# Patient Record
Sex: Female | Born: 1935 | Race: White | Hispanic: No | Smoking: Never smoker
Health system: Southern US, Community
[De-identification: ages and names within clinical notes are randomized; demographics above are authoritative.]

## PROBLEM LIST (undated history)

## (undated) DIAGNOSIS — I509 Heart failure, unspecified: Secondary | ICD-10-CM

## (undated) DIAGNOSIS — R42 Dizziness and giddiness: Secondary | ICD-10-CM

## (undated) DIAGNOSIS — F0390 Unspecified dementia without behavioral disturbance: Secondary | ICD-10-CM

## (undated) DIAGNOSIS — F329 Major depressive disorder, single episode, unspecified: Secondary | ICD-10-CM

## (undated) DIAGNOSIS — I10 Essential (primary) hypertension: Secondary | ICD-10-CM

## (undated) DIAGNOSIS — G309 Alzheimer's disease, unspecified: Secondary | ICD-10-CM

## (undated) DIAGNOSIS — I428 Other cardiomyopathies: Secondary | ICD-10-CM

## (undated) DIAGNOSIS — Z90710 Acquired absence of both cervix and uterus: Secondary | ICD-10-CM

## (undated) DIAGNOSIS — F32A Depression, unspecified: Secondary | ICD-10-CM

## (undated) DIAGNOSIS — F0280 Dementia in other diseases classified elsewhere without behavioral disturbance: Secondary | ICD-10-CM

## (undated) DIAGNOSIS — D509 Iron deficiency anemia, unspecified: Secondary | ICD-10-CM

## (undated) HISTORY — DX: Acquired absence of both cervix and uterus: Z90.710

## (undated) HISTORY — DX: Essential (primary) hypertension: I10

## (undated) HISTORY — DX: Major depressive disorder, single episode, unspecified: F32.9

## (undated) HISTORY — DX: Depression, unspecified: F32.A

## (undated) HISTORY — DX: Dizziness and giddiness: R42

## (undated) HISTORY — DX: Heart failure, unspecified: I50.9

## (undated) HISTORY — DX: Other cardiomyopathies: I42.8

## (undated) HISTORY — DX: Unspecified dementia, unspecified severity, without behavioral disturbance, psychotic disturbance, mood disturbance, and anxiety: F03.90

## (undated) HISTORY — DX: Iron deficiency anemia, unspecified: D50.9

---

## 2000-02-27 ENCOUNTER — Encounter: Payer: Self-pay | Admitting: Internal Medicine

## 2000-02-27 ENCOUNTER — Encounter: Admission: RE | Admit: 2000-02-27 | Discharge: 2000-02-27 | Payer: Self-pay | Admitting: Internal Medicine

## 2002-09-03 ENCOUNTER — Encounter: Payer: Self-pay | Admitting: Emergency Medicine

## 2002-09-03 ENCOUNTER — Emergency Department (HOSPITAL_COMMUNITY): Admission: EM | Admit: 2002-09-03 | Discharge: 2002-09-03 | Payer: Self-pay | Admitting: Emergency Medicine

## 2005-11-07 ENCOUNTER — Emergency Department (HOSPITAL_COMMUNITY): Admission: EM | Admit: 2005-11-07 | Discharge: 2005-11-07 | Payer: Self-pay | Admitting: Emergency Medicine

## 2006-01-03 ENCOUNTER — Encounter: Admission: RE | Admit: 2006-01-03 | Discharge: 2006-01-03 | Payer: Self-pay | Admitting: Internal Medicine

## 2006-03-07 ENCOUNTER — Inpatient Hospital Stay (HOSPITAL_COMMUNITY): Admission: EM | Admit: 2006-03-07 | Discharge: 2006-03-16 | Payer: Self-pay | Admitting: Emergency Medicine

## 2006-03-07 ENCOUNTER — Ambulatory Visit: Payer: Self-pay | Admitting: Vascular Surgery

## 2006-03-07 ENCOUNTER — Encounter: Payer: Self-pay | Admitting: Cardiology

## 2006-03-07 ENCOUNTER — Ambulatory Visit: Payer: Self-pay | Admitting: Internal Medicine

## 2006-03-23 ENCOUNTER — Ambulatory Visit: Payer: Self-pay | Admitting: Internal Medicine

## 2006-03-23 LAB — CONVERTED CEMR LAB
BUN: 24 mg/dL — ABNORMAL HIGH (ref 6–23)
CO2: 27 meq/L (ref 19–32)
Creatinine, Ser: 1.1 mg/dL (ref 0.4–1.2)
Digitoxin Lvl: 0.3 ng/mL — ABNORMAL LOW (ref 0.8–2.0)
GFR calc Af Amer: 60 mL/min
Glucose, Bld: 91 mg/dL (ref 70–99)
Potassium: 4 meq/L (ref 3.5–5.1)
Pro B Natriuretic peptide (BNP): 927 pg/mL — ABNORMAL HIGH (ref 0.0–100.0)
Sodium: 135 meq/L (ref 135–145)

## 2006-04-03 ENCOUNTER — Ambulatory Visit: Payer: Self-pay | Admitting: Internal Medicine

## 2006-04-16 ENCOUNTER — Encounter: Payer: Self-pay | Admitting: Internal Medicine

## 2006-04-16 ENCOUNTER — Ambulatory Visit: Payer: Self-pay | Admitting: Internal Medicine

## 2006-04-16 ENCOUNTER — Ambulatory Visit: Payer: Self-pay

## 2006-04-16 LAB — CONVERTED CEMR LAB
CO2: 33 meq/L — ABNORMAL HIGH (ref 19–32)
Calcium: 9.5 mg/dL (ref 8.4–10.5)
Chloride: 102 meq/L (ref 96–112)
Cholesterol: 183 mg/dL (ref 0–200)
Creatinine, Ser: 1 mg/dL (ref 0.4–1.2)
GFR calc Af Amer: 67 mL/min
GFR calc non Af Amer: 56 mL/min
Glucose, Bld: 100 mg/dL — ABNORMAL HIGH (ref 70–99)
HDL: 52.8 mg/dL (ref >39.0)
Pro B Natriuretic peptide (BNP): 261 pg/mL — ABNORMAL HIGH (ref 0.0–100.0)
Sodium: 141 meq/L (ref 135–145)
Triglycerides: 83 mg/dL (ref 0–149)

## 2006-04-27 ENCOUNTER — Ambulatory Visit: Payer: Self-pay | Admitting: Internal Medicine

## 2006-05-24 ENCOUNTER — Ambulatory Visit: Payer: Self-pay | Admitting: Internal Medicine

## 2006-07-13 ENCOUNTER — Ambulatory Visit: Payer: Self-pay | Admitting: Internal Medicine

## 2006-09-13 ENCOUNTER — Ambulatory Visit: Payer: Self-pay | Admitting: Internal Medicine

## 2006-09-26 ENCOUNTER — Ambulatory Visit: Payer: Self-pay | Admitting: Internal Medicine

## 2006-09-26 LAB — CONVERTED CEMR LAB
ALT: 15 units/L (ref 0–35)
AST: 24 units/L (ref 0–37)
Albumin: 3.4 g/dL — ABNORMAL LOW (ref 3.5–5.2)
Basophils Relative: 0.9 % (ref 0.0–1.0)
CO2: 31 meq/L (ref 19–32)
Calcium: 9.5 mg/dL (ref 8.4–10.5)
Chloride: 108 meq/L (ref 96–112)
Eosinophils Absolute: 0.5 10*3/uL (ref 0.0–0.6)
Eosinophils Relative: 6.3 % — ABNORMAL HIGH (ref 0.0–5.0)
GFR calc Af Amer: 67 mL/min
Glucose, Bld: 90 mg/dL (ref 70–99)
HDL: 52.6 mg/dL (ref >39.0)
LDL Cholesterol: 51 mg/dL (ref 0–99)
MCHC: 34.1 g/dL (ref 30.0–36.0)
MCV: 88.9 fL (ref 78.0–100.0)
Neutro Abs: 4.6 10*3/uL (ref 1.4–7.7)
Neutrophils Relative %: 56.2 % (ref 43.0–77.0)
Potassium: 4.4 meq/L (ref 3.5–5.1)
RBC: 4.14 M/uL (ref 3.87–5.11)
Total Protein: 6.7 g/dL (ref 6.0–8.3)

## 2006-12-18 ENCOUNTER — Ambulatory Visit: Payer: Self-pay | Admitting: Internal Medicine

## 2006-12-18 LAB — CONVERTED CEMR LAB
AST: 22 units/L (ref 0–37)
Albumin: 3.3 g/dL — ABNORMAL LOW (ref 3.5–5.2)
Alkaline Phosphatase: 76 units/L (ref 39–117)
BUN: 28 mg/dL — ABNORMAL HIGH (ref 6–23)
Bilirubin, Direct: 0.1 mg/dL (ref 0.0–0.3)
CO2: 27 meq/L (ref 19–32)
Calcium: 9.3 mg/dL (ref 8.4–10.5)
Cholesterol: 123 mg/dL (ref 0–200)
GFR calc Af Amer: 60 mL/min
Glucose, Bld: 90 mg/dL (ref 70–99)
Potassium: 4.3 meq/L (ref 3.5–5.1)

## 2006-12-26 ENCOUNTER — Ambulatory Visit: Payer: Self-pay

## 2007-01-30 ENCOUNTER — Encounter: Admission: RE | Admit: 2007-01-30 | Discharge: 2007-01-30 | Payer: Self-pay | Admitting: Internal Medicine

## 2007-04-19 ENCOUNTER — Ambulatory Visit: Payer: Self-pay | Admitting: Internal Medicine

## 2007-04-19 LAB — CONVERTED CEMR LAB
ALT: 15 units/L (ref 0–35)
Albumin: 3.3 g/dL — ABNORMAL LOW (ref 3.5–5.2)
BUN: 24 mg/dL — ABNORMAL HIGH (ref 6–23)
Chloride: 104 meq/L (ref 96–112)
Creatinine, Ser: 1.1 mg/dL (ref 0.4–1.2)
GFR calc Af Amer: 60 mL/min
GFR calc non Af Amer: 50 mL/min
LDL Cholesterol: 39 mg/dL (ref 0–99)
Potassium: 4.3 meq/L (ref 3.5–5.1)
Pro B Natriuretic peptide (BNP): 121 pg/mL — ABNORMAL HIGH (ref 0.0–100.0)
Total Bilirubin: 0.8 mg/dL (ref 0.3–1.2)
Total Protein: 6.6 g/dL (ref 6.0–8.3)
Triglycerides: 79 mg/dL (ref 0–149)

## 2007-04-23 ENCOUNTER — Ambulatory Visit: Payer: Self-pay | Admitting: Internal Medicine

## 2007-04-23 LAB — CONVERTED CEMR LAB
CO2: 29 meq/L (ref 19–32)
Calcium: 9.4 mg/dL (ref 8.4–10.5)
GFR calc Af Amer: 55 mL/min
GFR calc non Af Amer: 45 mL/min

## 2007-05-01 ENCOUNTER — Ambulatory Visit: Payer: Self-pay | Admitting: Cardiovascular Disease

## 2007-05-13 ENCOUNTER — Ambulatory Visit: Payer: Self-pay | Admitting: Internal Medicine

## 2007-05-13 LAB — CONVERTED CEMR LAB
BUN: 33 mg/dL — ABNORMAL HIGH (ref 6–23)
Chloride: 104 meq/L (ref 96–112)
Creatinine, Ser: 1.3 mg/dL — ABNORMAL HIGH (ref 0.4–1.2)

## 2007-06-13 ENCOUNTER — Inpatient Hospital Stay (HOSPITAL_COMMUNITY): Admission: EM | Admit: 2007-06-13 | Discharge: 2007-06-15 | Payer: Self-pay | Admitting: Emergency Medicine

## 2007-07-18 ENCOUNTER — Ambulatory Visit: Payer: Self-pay | Admitting: Internal Medicine

## 2007-08-13 ENCOUNTER — Emergency Department (HOSPITAL_COMMUNITY): Admission: EM | Admit: 2007-08-13 | Discharge: 2007-08-13 | Payer: Self-pay | Admitting: Emergency Medicine

## 2007-10-21 ENCOUNTER — Encounter: Payer: Self-pay | Admitting: Internal Medicine

## 2007-10-22 ENCOUNTER — Ambulatory Visit: Payer: Self-pay | Admitting: Internal Medicine

## 2007-10-22 LAB — CONVERTED CEMR LAB
AST: 27 units/L (ref 0–37)
Alkaline Phosphatase: 83 units/L (ref 39–117)
Basophils Relative: 0.9 % (ref 0.0–3.0)
Bilirubin, Direct: 0.2 mg/dL (ref 0.0–0.3)
Cholesterol: 132 mg/dL (ref 0–200)
Eosinophils Absolute: 0.2 10*3/uL (ref 0.0–0.7)
GFR calc non Af Amer: 55 mL/min
Glucose, Bld: 87 mg/dL (ref 70–99)
HCT: 38.7 % (ref 36.0–46.0)
HDL: 56.2 mg/dL (ref >39.0)
Hemoglobin: 13.5 g/dL (ref 12.0–15.0)
Iron: 35 ug/dL — ABNORMAL LOW (ref 42–145)
LDL Cholesterol: 62 mg/dL (ref 0–99)
MCHC: 34.8 g/dL (ref 30.0–36.0)
MCV: 89.6 fL (ref 78.0–100.0)
Monocytes Absolute: 0.7 10*3/uL (ref 0.1–1.0)
Monocytes Relative: 10.7 % (ref 3.0–12.0)
Neutrophils Relative %: 64.6 % (ref 43.0–77.0)
Platelets: 265 10*3/uL (ref 150–400)
Sodium: 139 meq/L (ref 135–145)
Total Bilirubin: 0.6 mg/dL (ref 0.3–1.2)
Total CHOL/HDL Ratio: 2.3
Total Protein: 6.7 g/dL (ref 6.0–8.3)
Triglycerides: 70 mg/dL (ref 0–149)
VLDL: 14 mg/dL (ref 0–40)

## 2008-02-11 ENCOUNTER — Ambulatory Visit: Payer: Self-pay | Admitting: Internal Medicine

## 2008-05-01 ENCOUNTER — Encounter (INDEPENDENT_AMBULATORY_CARE_PROVIDER_SITE_OTHER): Payer: Self-pay

## 2008-05-04 ENCOUNTER — Encounter: Payer: Self-pay | Admitting: Internal Medicine

## 2008-05-04 ENCOUNTER — Ambulatory Visit: Payer: Self-pay | Admitting: Internal Medicine

## 2008-05-04 DIAGNOSIS — I501 Left ventricular failure: Secondary | ICD-10-CM

## 2008-05-04 DIAGNOSIS — I10 Essential (primary) hypertension: Secondary | ICD-10-CM

## 2009-01-04 ENCOUNTER — Ambulatory Visit: Payer: Self-pay | Admitting: Internal Medicine

## 2009-03-03 ENCOUNTER — Emergency Department (HOSPITAL_COMMUNITY): Admission: EM | Admit: 2009-03-03 | Discharge: 2009-03-03 | Payer: Self-pay | Admitting: Emergency Medicine

## 2009-06-25 ENCOUNTER — Ambulatory Visit: Payer: Self-pay | Admitting: Internal Medicine

## 2009-06-25 DIAGNOSIS — E785 Hyperlipidemia, unspecified: Secondary | ICD-10-CM

## 2009-07-20 ENCOUNTER — Ambulatory Visit: Payer: Self-pay | Admitting: Cardiology

## 2009-07-20 ENCOUNTER — Ambulatory Visit (HOSPITAL_COMMUNITY)
Admission: RE | Admit: 2009-07-20 | Discharge: 2009-07-20 | Payer: Self-pay | Source: Home / Self Care | Admitting: Internal Medicine

## 2009-07-20 ENCOUNTER — Ambulatory Visit: Payer: Self-pay

## 2009-07-20 ENCOUNTER — Encounter: Payer: Self-pay | Admitting: Internal Medicine

## 2009-12-31 ENCOUNTER — Ambulatory Visit: Payer: Self-pay | Admitting: Internal Medicine

## 2009-12-31 ENCOUNTER — Telehealth: Payer: Self-pay | Admitting: Internal Medicine

## 2009-12-31 ENCOUNTER — Encounter: Payer: Self-pay | Admitting: Internal Medicine

## 2009-12-31 DIAGNOSIS — I959 Hypotension, unspecified: Secondary | ICD-10-CM

## 2009-12-31 LAB — CONVERTED CEMR LAB
Basophils Relative: 1.2 % (ref 0.0–3.0)
CO2: 27 meq/L (ref 19–32)
Calcium: 9.4 mg/dL (ref 8.4–10.5)
GFR calc non Af Amer: 46.48 mL/min — ABNORMAL LOW (ref >60.00)
Glucose, Bld: 86 mg/dL (ref 70–99)
HCT: 21.3 % — CL (ref 36.0–46.0)
MCHC: 31.3 g/dL (ref 30.0–36.0)
MCV: 66 fL — ABNORMAL LOW (ref 78.0–100.0)
Monocytes Relative: 15.8 % — ABNORMAL HIGH (ref 3.0–12.0)
Platelets: 354 10*3/uL (ref 150.0–400.0)
Potassium: 5.1 meq/L (ref 3.5–5.1)
RDW: 20.4 % — ABNORMAL HIGH (ref 11.5–14.6)
WBC: 6.4 10*3/uL (ref 4.5–10.5)

## 2010-01-01 ENCOUNTER — Emergency Department (HOSPITAL_COMMUNITY)
Admission: EM | Admit: 2010-01-01 | Discharge: 2010-01-01 | Payer: Self-pay | Source: Home / Self Care | Admitting: Emergency Medicine

## 2010-02-20 LAB — CONVERTED CEMR LAB
ALT: 12 units/L (ref 0–35)
Albumin: 3.5 g/dL (ref 3.5–5.2)
Basophils Absolute: 0.1 10*3/uL (ref 0.0–0.1)
Basophils Relative: 1 % (ref 0.0–1.0)
CO2: 29 meq/L (ref 19–32)
Chloride: 102 meq/L (ref 96–112)
Eosinophils Absolute: 0.2 10*3/uL (ref 0.0–0.7)
Eosinophils Relative: 3.3 % (ref 0.0–5.0)
GFR calc non Af Amer: 50 mL/min
Lymphocytes Relative: 21.3 % (ref 12.0–46.0)
MCHC: 32.7 g/dL (ref 30.0–36.0)
RDW: 29.6 % — ABNORMAL HIGH (ref 11.5–14.6)
Sodium: 137 meq/L (ref 135–145)
WBC: 7.5 10*3/uL (ref 4.5–10.5)

## 2010-02-24 NOTE — Assessment & Plan Note (Signed)
Summary: 6 month rov/sl  Medications Added LISINOPRIL 20 MG TABS (LISINOPRIL) Take one tablet by mouth daily. FUROSEMIDE 20 MG TABS (FUROSEMIDE) Take one tablet by mouth daily.        Visit Type:  Follow-up Primary Provider:  Leanord Hawking  CC:  no complaints.  History of Present Illness: [name] is a delightful 77 year old woman with a  history of congestive heart failures due to probable nonischemic cardiomyopathy, EF was about 20%, right now normalized to 60%.  She also has a history of hypertension, hyperlipidemia, and iron-deficiency anemia, for which she has refused endoscopy. She returns for routine f/u.  Doing well. Still getting back and forth to dining room with walker. Gets SOB if she walks too fast. Son says she is slowing down. Marland Kitchen No swelling or orthopnea. No dizziness, presyncope or syncope. BP checked regulary at [facility] seems to be running a bit low (low 100s). Occasionally a little dizzy when she stands up.    Current Medications (verified): 1)  Colace .... Take As Directed 2)  Zoloft 100 Mg Tabs (Sertraline Hcl) .... Take One Tab Once Daily 3)  Potassium Chloride Crys Cr 20 Meq Cr-Tabs (Potassium Chloride Crys Cr) .... Take One Tablet By Mouth Twice A Day 4)  Lisinopril 20 Mg Tabs (Lisinopril) .... Take One Tablet By Mouth Two Times A Day 5)  Aspirin 81 Mg Tbec (Aspirin) .... Take One Tablet By Mouth Daily 6)  Coreg 25 Mg Tabs (Carvedilol) .... Take One Tab Two Times A Day 7)  Lasix 40 Mg Tabs (Furosemide) .... Take One Tab Once Daily 8)  Qc Tussin Cf 5-10-100 Mg/66ml Liqd (Phenylephrine-Dm-Gg) .... As Needed For Congestion 9)  Ibuprofen 400 Mg Tabs (Ibuprofen) .... Take One Tablet Every 4 Hrs As Needed 10)  Simvastatin 20 Mg Tabs (Simvastatin) .... Take One Tablet By Mouth Daily At Bedtime  Allergies: No Known Drug Allergies  Past History:  Past Medical History: Last updated: 05/04/2008  1. CHF, due to probable non-ischemic cardiomyopathy  (recovered)        a. ECHO 02/2006: EF 15-20%        b. ECHO 12/2006 . EF 60%  2. HTN, previously severe  3. Iron deficiency anemia       a. refuses endoscopy  4. Dementia , very mild  5. Dizziness  6. Depression  7. Hearing loss  Review of Systems       As per HPI and past medical history; otherwise all systems negative.   Vital Signs:  Patient profile:   77 year old female Height:      61 inches Weight:      165 pounds BMI:     31.29 Pulse rate:   80 / minute BP sitting:   98 / 54  (right arm) Cuff size:   regular  Vitals Entered By: Hardin Negus, RMA (December 31, 2009 9:54 AM)  Physical Exam  General:  Elderly. well appearing. no resp difficulty HEENT: normal Neck: supple. no JVD. Carotids 2+ bilat; no bruits. No lymphadenopathy or thryomegaly appreciated. Cor: PMI nondisplaced. Regular rate & rhythm. No rubs, gallops, murmur. Lungs: clear Abdomen: soft, nontender, nondistended. No hepatosplenomegaly. No bruits or masses. Good bowel sounds. Extremities: no cyanosis, clubbing, rash, no edema on R none on left Neuro: alert & orientedx3, cranial nerves grossly intact. moves all 4 extremities w/o difficulty. affect pleasant    Impression & Recommendations:  Problem # 1:  HYPOTENSION, UNSPECIFIED (ICD-458.9) BP running low. Will decrease lasix to  20 once daily and lisinopril to 20 once daily . [facility] every other day for 2 weeks and if SBP continue to run low they should contact us. Check BMET and CBC today. Watch closely for edema.   Problem # 2:  CONGESTIVE HEART FAILURE, LEFT (ICD-428.1) LVEF recovered. No signs or symptoms.  Other Orders: EKG w/ Interpretation (93000) TLB-BMP (Basic Metabolic Panel-BMET) (80048-METABOL) TLB-CBC Platelet - w/Differential (85025-CBCD)  Patient Instructions: 1)  Your physician recommends that you schedule a follow-up appointment in: 4 months. 2)  Your physician has recommended you make  the following change in your medication: DECREASE lasix to 20mg  by mouth daily and DECREASE Lisinopril to 20mg  by mouth daily.  3)  PLEASE monitor her blood pressure every other day for 2 weeks and call us if her SBP < 100 or you notice any swelling. Prescriptions: LISINOPRIL 20 MG TABS (LISINOPRIL) Take one tablet by mouth daily.  #30 x 6   Entered by:   Ellender Hose RN   Authorized by:   Dolores Patty, MD, [facility]   Signed by:   Ellender Hose RN on 12/31/2009   Method used:   Print then Give to Patient   RxID:   [id number] FUROSEMIDE 20 MG TABS (FUROSEMIDE) Take one tablet by mouth daily.  #30 x 6   Entered by:   Ellender Hose RN   Authorized by:   Dolores Patty, MD, [facility]   Signed by:   Ellender Hose RN on 12/31/2009   Method used:   Print then Give to Patient   RxID:   [id number]

## 2010-02-24 NOTE — Progress Notes (Signed)
Summary: Low Hgb (need to fax results to pt facility)   Phone Note From Other Clinic   Caller: Jacki Cones from the [employer] of Call: Call received from [place] in the lab at [facility]- she reports the pt's Hgb is 6.7/ Hct 21.3. I have reviewed this with Dr. Gala Romney. He states the pt has known iron deficiency anemia, but has refused a workup for this. He wants [place] to fax the pt's lab results to [facility] and then call and let them know to have the MD there f/u on this. He would also like [place] to call the pt's son and let him know the results. I will forward this message to the triage desktop since the pt's labs are not yet posted to her chart. Initial call taken by: Sherri Rad, RN, BSN,  December 31, 2009 2:20 PM  Follow-up for Phone Call        Called pt's nurse at [facility] and she advised that I can fax lab work to her for Dr.Robson to review at [phone]. Layne Benton, RN, BSN  December 31, 2009 4:45 PM

## 2010-02-24 NOTE — Assessment & Plan Note (Signed)
Summary: f81m  Medications Added QC TUSSIN CF 5-10-100 MG/5ML LIQD (PHENYLEPHRINE-DM-GG) as needed for congestion IBUPROFEN 400 MG TABS (IBUPROFEN) take one tablet every 4 hrs as needed SIMVASTATIN 20 MG TABS (SIMVASTATIN) Take one tablet by mouth daily at bedtime      Allergies Added: NKDA  Primary Provider:  Leanord Hawking  CC:  f58m.  History of Present Illness: [name] is a delightful 77 year old woman with a  history of congestive heart failures due to probable nonischemic cardiomyopathy, EF was about 20%, right now normalized to 60%.  She also has a history of hypertension, hyperlipidemia, and iron-deficiency anemia, for which she has refused endoscopy. She returns for routine f/u.  Doing great.  Gets SOB walking longer distances. No CP. Walker really helping. No swelling or orthopnea. No dizziness, presyncope or syncope. For some reason simvastatin stopped. Denies muscle aches.    No SBP log from [facility] sent.     Current Medications (verified): 1)  Colace .... Take As Directed 2)  Zoloft 100 Mg Tabs (Sertraline Hcl) .... Take One Tab Once Daily 3)  Potassium Chloride Crys Cr 20 Meq Cr-Tabs (Potassium Chloride Crys Cr) .... Take One Tablet By Mouth Twice A Day 4)  Lisinopril 20 Mg Tabs (Lisinopril) .... Take One Tablet By Mouth Two Times A Day 5)  Aspirin 81 Mg Tbec (Aspirin) .... Take One Tablet By Mouth Daily 6)  Coreg 25 Mg Tabs (Carvedilol) .... Take One Tab Two Times A Day 7)  Lasix 40 Mg Tabs (Furosemide) .... Take One Tab Once Daily 8)  Qc Tussin Cf 5-10-100 Mg/38ml Liqd (Phenylephrine-Dm-Gg) .... As Needed For Congestion 9)  Ibuprofen 400 Mg Tabs (Ibuprofen) .... Take One Tablet Every 4 Hrs As Needed  Allergies (verified): No Known Drug Allergies  Past History:  Past Medical History: Reviewed history from 05/04/2008 and no changes required.  1. CHF, due to probable non-ischemic cardiomyopathy (recovered)        a. ECHO 02/2006: EF 15-20%  b. ECHO 12/2006 . EF 60%  2. HTN, previously severe  3. Iron deficiency anemia       a. refuses endoscopy  4. Dementia , very mild  5. Dizziness  6. Depression  7. Hearing loss  Review of Systems       As per HPI and past medical history; otherwise all systems negative.   Vital Signs:  Patient profile:   77 year old female Height:      61 inches Weight:      167 pounds BMI:     31.67 Pulse rate:   80 / minute Pulse rhythm:   regular BP sitting:   100 / 54  (left arm) Cuff size:   large  Vitals Entered By: Judithe Modest CMA (June 25, 2009 10:22 AM)  Physical Exam  General:  Elderly. well appearing. no resp difficulty HEENT: normal Neck: supple. no JVD. Carotids 2+ bilat; no bruits. No lymphadenopathy or thryomegaly appreciated. Cor: PMI nondisplaced. Regular rate & rhythm. No rubs, gallops, murmur. Lungs: clear Abdomen: soft, nontender, nondistended. No hepatosplenomegaly. No bruits or masses. Good bowel sounds. Extremities: no cyanosis, clubbing, rash, tr edema on R none on left Neuro: alert & orientedx3, cranial nerves grossly intact. moves all 4 extremities w/o difficulty. affect pleasant    Impression & Recommendations:  Problem # 1:  CONGESTIVE HEART FAILURE, LEFT (ICD-428.1) LVEF recovered. No signs or symptoms. Last echo 2008. Will recheck echo to make sure EF remains stable. Check BNP.  Problem # 2:  HYPERTENSION, BENIGN (ICD-401.1) Blood pressure well controlled. Continue current regimen.  Problem # 3:  HYPERLIPIDEMIA-MIXED (ICD-272.4) For some reason simvastatinwas stopped. Will restart at previous dose.   Other Orders: EKG w/ Interpretation (93000)  Patient Instructions: 1)  Restart Simvastatin 20mg  daily 2)  Labs at Home (bnp 425.4) 3)  Your physician has requested that you have an echocardiogram.  Echocardiography is a painless test that uses sound waves to create images of your heart. It provides your doctor with information about the size and  shape of your heart and how well your heart's chambers and valves are working.  This procedure takes approximately one hour. There are no restrictions for this procedure. 4)  Your physician wants you to follow-up in:  6 months.  You will receive a reminder letter in the mail two months in advance. If you don't receive a letter, please call our office to schedule the follow-up appointment.   Appended Document: orders    Clinical Lists Changes  Orders: Added new Referral order of Echocardiogram (Echo) - Signed

## 2010-04-04 LAB — COMPREHENSIVE METABOLIC PANEL
AST: 24 U/L (ref 0–37)
Albumin: 3.2 g/dL — ABNORMAL LOW (ref 3.5–5.2)
Alkaline Phosphatase: 72 U/L (ref 39–117)
BUN: 27 mg/dL — ABNORMAL HIGH (ref 6–23)
CO2: 27 mEq/L (ref 19–32)
Calcium: 9 mg/dL (ref 8.4–10.5)
Chloride: 100 mEq/L (ref 96–112)
Creatinine, Ser: 1.12 mg/dL (ref 0.4–1.2)
GFR calc Af Amer: 55 mL/min — ABNORMAL LOW (ref >60)
GFR calc non Af Amer: 46 mL/min — ABNORMAL LOW (ref >60)
Potassium: 4.9 mEq/L (ref 3.5–5.1)
Total Bilirubin: 0.6 mg/dL (ref 0.3–1.2)

## 2010-04-04 LAB — CROSSMATCH
ABO/RH(D): A POS
Unit division: 0

## 2010-04-04 LAB — URINALYSIS, ROUTINE W REFLEX MICROSCOPIC
Bilirubin Urine: NEGATIVE
Glucose, UA: NEGATIVE mg/dL
Hgb urine dipstick: NEGATIVE
Ketones, ur: NEGATIVE mg/dL
Specific Gravity, Urine: 1.008 (ref 1.005–1.030)
pH: 5.5 (ref 5.0–8.0)

## 2010-04-04 LAB — DIFFERENTIAL
Eosinophils Absolute: 0.1 10*3/uL (ref 0.0–0.7)
Eosinophils Relative: 2 % (ref 0–5)
Monocytes Absolute: 1 10*3/uL (ref 0.1–1.0)
Monocytes Relative: 13 % — ABNORMAL HIGH (ref 3–12)
Neutrophils Relative %: 70 % (ref 43–77)

## 2010-04-04 LAB — CBC: RBC: 3.21 MIL/uL — ABNORMAL LOW (ref 3.87–5.11)

## 2010-04-13 LAB — URINALYSIS, ROUTINE W REFLEX MICROSCOPIC
Bilirubin Urine: NEGATIVE
Glucose, UA: NEGATIVE mg/dL
Hgb urine dipstick: NEGATIVE
Nitrite: NEGATIVE
pH: 7 (ref 5.0–8.0)

## 2010-05-19 ENCOUNTER — Encounter: Payer: Self-pay | Admitting: Internal Medicine

## 2010-05-20 ENCOUNTER — Encounter: Payer: Self-pay | Admitting: Internal Medicine

## 2010-05-20 ENCOUNTER — Ambulatory Visit (INDEPENDENT_AMBULATORY_CARE_PROVIDER_SITE_OTHER): Payer: Medicare Other | Admitting: Internal Medicine

## 2010-05-20 VITALS — BP 148/80 | HR 78 | Resp 18 | Ht 62.0 in | Wt 164.8 lb

## 2010-05-20 DIAGNOSIS — I501 Left ventricular failure: Secondary | ICD-10-CM

## 2010-05-20 DIAGNOSIS — I10 Essential (primary) hypertension: Secondary | ICD-10-CM

## 2010-05-20 NOTE — Progress Notes (Signed)
HPI:  [name] is a delightful 77 year old woman (Happy Birthday!) with a  history of congestive heart failures due to probable nonischemic cardiomyopathy, EF was about 20%, right now normalized to 60%.  She also has a history of hypertension, hyperlipidemia, and iron-deficiency anemia, for which she has refused endoscopy. She returns for routine f/u.  Continues to do very well from a cardiac perspective. A few weeks ago had an episode when her BP went up to 160-180 range. BP trend on nursing chart 101/57 to 142/72. No CP or SOB. Ambulates slowly with walker. On iron supplementation and gets CB checks every other month and hgb stable    ROS: All systems negative except as listed in HPI, PMH and Problem List.  Past Medical History  Diagnosis Date  . CHF (congestive heart failure)     due to probable non-ischemic cardiomyopathy (recovered)  a. ECHO 02/2006: EF 15-20% b. ECHO 12/2006 . EF 60%   . Non-ischemic cardiomyopathy   . HTN (hypertension)     previously severe  . Iron deficiency anemia     a. refuses endoscopy  . Dementia     very mild  . Dizziness   . Depression   . Hearing loss   . H/O: hysterectomy     Current Outpatient Prescriptions  Medication Sig Dispense Refill  . aspirin 81 MG EC tablet Take 81 mg by mouth daily.        . carvedilol (COREG) 25 MG tablet Take 25 mg by mouth 2 (two) times daily.        . ferrous sulfate 325 (65 FE) MG tablet Take 325 mg by mouth daily with breakfast.        . furosemide (LASIX) 20 MG tablet Take 20 mg by mouth daily.        Marland Kitchen ibuprofen (ADVIL,MOTRIN) 400 MG tablet Take 400 mg by mouth every 4 (four) hours as needed.        Marland Kitchen lisinopril (PRINIVIL,ZESTRIL) 20 MG tablet Take 20 mg by mouth daily.        . NON FORMULARY Colace - Take as Directed       . Phenylephrine-DM-GG (QC TUSSIN CF) 5-10-100 MG/5ML LIQD As needed for congestion.       . potassium chloride SA (K-DUR,KLOR-CON) 20 MEQ tablet Take 20 mEq by mouth 2 (two) times daily.         . sertraline (ZOLOFT) 100 MG tablet Take 100 mg by mouth daily.        . simvastatin (ZOCOR) 20 MG tablet Take 20 mg by mouth at bedtime.           PHYSICAL EXAM: Filed Vitals:   05/20/10 1009  BP: 148/80  Pulse: 78  Resp: 18   General:  Elderly. well appearing. no resp difficulty HEENT: normal Neck: supple. no JVD. Carotids 2+ bilat; no bruits. No lymphadenopathy or thryomegaly appreciated. Cor: PMI nondisplaced. Regular rate & rhythm. No rubs, gallops, murmur. Lungs: clear Abdomen: soft, nontender, nondistended. No hepatosplenomegaly. No bruits or masses. Good bowel sounds. Extremities: no cyanosis, clubbing, rash, trace edema on R none on left Neuro: alert & orientedx3, cranial nerves grossly intact. moves all 4 extremities w/o difficulty. affect pleasant   ECG: NSR 78. Septal qs Poor R wave progression. No ST-T wave abnormalities.     ASSESSMENT & PLAN:

## 2010-05-20 NOTE — Assessment & Plan Note (Signed)
BP very labile and somewhat elevated at times but not dangerously so. I think the risk of symptomatic hypotension is much higher than a bit of hypertension so I am OK with letting her BP drift a little bit. Would only increase BP meds if SBP consistently over 150 or 160.

## 2010-05-20 NOTE — Assessment & Plan Note (Signed)
Continues to do very well. LV function normalized. Volume status looks good. Continue current therapy.

## 2010-06-07 NOTE — Assessment & Plan Note (Signed)
[facility]                            CARDIOLOGY OFFICE NOTE   CLORENE, NERIO                       MRN:          [id number]  DATE:07/18/2007                            DOB:          Dec 10, 1916    INTERVAL HISTORY:  [name] is a very pleasant 77 year old woman with a  history of congestive heart failure due to probable nonischemic  cardiomyopathy.  Initial EF was about 20%,  but it is now normalized to  60%.  She has a history of hypertension and hyperlipidemia.  She has  been living at the [facility]   Unfortunately, she was recently admitted in May with some mild chest  discomfort and found to be severely anemic with a hemoglobin of 7.  She  was transfused several units.  She refused endoscopy.  She was started  on iron and [facility].  Her cardiac markers  remain negative in the hospital.   She is back feeling better.  She does feel weak at times, but denies any  chest pain or shortness of breath.  She notes a little swelling of the  right ankle today, but has not had significant troubles with this.  Her  hemoglobin most recently was checked about 3 weeks ago and was 11.   CURRENT MEDICATIONS:  1. Lasix 40 mg a day.  2. Colace 100 at night.  3. Sertraline 100 mg a day.  4. Potassium 20 b.i.d.  5. Lisinopril 20 a day.  6. Aspirin 81.  7. Coreg 25 b.i.d.  8. Simvastatin 20 at night.  9. Iron 325 a day.   PHYSICAL EXAMINATION:  GENERAL:  She is well-appearing, in no acute  distress.  Ambulatory in the clinic slow without any respiratory  difficulty.  VITAL SIGNS:  Blood pressure 124/72, heart rate 64, weight 169.  HEENT:  Normal.  NECK:  Supple.  No JVD.  Carotids are 2+ bilateral without bruits.  There is no lymphadenopathy or thyromegaly.  CARDIAC:  PMI is nondisplaced.  She is regular with no murmurs, rubs, or  gallops.  LUNGS:  Clear.  ABDOMEN:  Soft, nontender, nondistended, no  hepatosplenomegaly, no  bruits, no masses.  Good bowel sounds.  EXTREMITIES:  Warm with no cyanosis or clubbing.  There is trace edema  on the right, which is chronic than on the left.  NEUROLOGIC:  Alert and oriented x3.  Cranial nerves II-XII are intact.  Moves all four extremities without difficulty.   ASSESSMENT:  1. Congestive heart failure with now normalized ejection fraction.      She is doing well.  Continue current therapy.  2. Hypertension.  Blood pressure is well-controlled.  3. Anemia.  She continues to refuse endoscopy, she is maintained on      iron and we will check a CBC today.   DISPOSITION:  We will see her back in 3-4 months for routine followup.     Bevelyn Buckles. Bensimhon, MD  Electronically Signed    DRB/MedQ  DD: 07/18/2007  DT: 07/18/2007  Job #: [id number]   cc:   [place]  [facility]

## 2010-06-07 NOTE — Assessment & Plan Note (Signed)
[facility]                            CARDIOLOGY OFFICE NOTE   NIYONNA, BETSILL                       MRN:          [id number]  DATE:10/22/2007                            DOB:          1916/12/24    HISTORY:  Lodie is a very pleasant 77 year old woman with a history of  congestive heart failure due to probable nonischemic cardiomyopathy.  EF  was about 20% about a year or two ago, but now normalized at 60%.  She  also has a history of hypertension, hyperlipidemia, and iron-deficiency  anemia for which required transfusion back in May.  She refused  endoscopy.   She returns today with her son for routine followup.  She continues to  live at the [facility].  She is doing very well.  She  does have occasional dyspnea, but no chest pain.  No significant  orthopnea, PND, or mild lower extremity edema.  Unfortunately, she did  have a fall out of bed and had a significant skin tear on her right  elbow and injury to her leg, but did not have any fractures.  She is  recovered from that well.   CURRENT MEDICATIONS:  1. Colace 100 a day.  2. Zoloft 100 a day.  3. Potassium 20 b.i.d.  4. Lisinopril 20 a day.  5. Aspirin 81.  6. Coreg 25 b.i.d.  7. Simvastatin 20 a day.  8. Iron 325 a day.   PHYSICAL EXAMINATION:  GENERAL:  She is an elderly woman in no acute  distress.  Ambulates around the clinic without any respiratory  difficulty.  Blood pressure is 134/80; heart rate 68; and weight is 168,  which is stable.  HEENT:  Normal.  NECK:  Supple.  No JVD.  Carotids are 2+ bilaterally without bruits.  There is no lymphadenopathy or thyromegaly.  CARDIAC:  PMI is nondisplaced.  She is regular with no murmurs, rubs, or  gallops.  LUNGS:  Clear.  ABDOMEN:  Soft, nontender, and nondistended.  No hepatosplenomegaly.  No  bruits.  No masses.  Good bowel sounds.  EXTREMITIES:  Warm with no cyanosis or clubbing.  There is trace edema  on  the right, which is chronic and none on the left.  She has a healing  skin tear on the right elbow and some ecchymosis on the right shin.  NEURO:  Alert and oriented x3.  Cranial nerves II-XII are intact.  Moves  all 4 extremities without difficulty.   ASSESSMENT AND PLAN:  1. Congestive heart failure, now with normalized ejection fraction.      She is doing well.  Continue current therapy.  She is [place]      Heart Association class II-III.  2. Hypertension.  Blood pressure is doing okay.  Continue current      therapy.  3. Anemia.  She refuses endoscopy.  She is maintained on iron.  We      will check a CBC today and iron stores.   DISPOSITION:  Overall, doing quite well.  We will see her back in 3-4  months  for routine followup.     Bevelyn Buckles. Bensimhon, MD  Electronically Signed    DRB/MedQ  DD: 10/22/2007  DT: 10/22/2007  Job #: [id number]   cc:   Lenon Curt Chilton Si, M.D.

## 2010-06-07 NOTE — Discharge Summary (Signed)
NAMEREDINA, [name] NO.:  [id number]   MEDICAL RECORD NO.:  [id number]          PATIENT TYPE:  INP   LOCATION:  3017                         FACILITY:  [facility]   PHYSICIAN:  Hettie Holstein, D.O.    DATE OF BIRTH:  1916-05-04   DATE OF ADMISSION:  06/13/2007  DATE OF DISCHARGE:  06/15/2007                               DISCHARGE SUMMARY   PRIMARY CARE PHYSICIAN:  Maxwell Caul, M.D., of [facility].   FINAL DIAGNOSIS:  Anemia of iron deficiency felt to be chronic with a  presenting hemoglobin of 7.3, status post 2 units of packed red blood  cell transfusion to a final hemoglobin to 8.8.  Her iron percent  saturation could not be calculated.  Her serum iron was less than 10.  The patient reports a previous history of iron deficiency.  She is quite  averse to a colonoscopy though her stool Hemoccult was negative.  Rectal  exam revealed no appreciable rectal masses.   SECONDARY DIAGNOSES:  1. Dizziness, resolved.  2. Compensated systolic congestive heart failure.  She seems on the      hypovolemic side.  She had been on twice-daily dosing of Lasix.      This is being decreased to daily for now with a reassessment and      probable reinstitution of twice-daily dosing if she does start to      show clinical signs of fluid retention.  3. Dementia.  4. Hypertension.  5. No Code Blue status.   STUDIES PERFORMED THIS ADMISSION:  As described above, her hemoglobin  was 8.8, platelet count 298, MCV was 72.  Iron stores as described  above.  Her B12 and folate were within normal range.  Potassium was 3.3,  she is being repleted prior to discharge.  BUN is 22, creatinine is  1.12.   DISPOSITION:  At present Ms. Oldenburg is medically stable for  transition back to the [facility].  End-of-life issues  and code status issues have been addressed.  Ms. Frick desires not  to be artificially resuscitated and not subjected to  heroics.  No Code  Blue status was maintained during her hospitalization.  In any event,  she is ambulatory without symptoms and desires to return to [facility],   HISTORY OF PRESENTING ILLNESS AND HOSPITAL COURSE:  Ms. Matlin is a  very pleasant 77 year old female who presented on the 21st with  description of chest tightness and dizziness per others' account.  She  does not recall these symptoms.  In any event, her cardiac markers were  cycled without evidence of acute ischemic event.  Her BNP was 105 on  presentation and review of latest echocardiogram per [facility]  revealed an ejection fraction of 60% in December 2008.  There was some  vague description of events at the skilled facility.  Her son is quite  attuned to her general status and he felt this had been changed as well  as some pallor that was noted.  Her EKG in the emergency department was  not  indicative of ischemia.  Cardiac markers, as noted above, were  negative.  There were some reports of diarrhea but no reports of dark,  tarry or bloody stools.  In any event, she remained stable and  asymptomatic throughout her course.  She underwent 2 units of PRBC  transfusion with satisfactory response.  She is being initiated on iron  therapy in addition to her routine chronic medications.   MEDICATION LIST:  Her Lasix dose is being decreased to daily for a short  period of time until clinically her volume status improves such that she  can resume b.i.d. dosing as indicated.  I would recommend obtaining a  basic metabolic panel within the following week to assure that her BUN  and creatinine have remained stable in addition to a CBC to assure that  her hemoglobin remains stable or is, hopefully, improving with iron  therapy.   1. Iron sulfate at 325 mg p.o. b.i.d. is being prescribed.  2. She can resume sertraline 100 mg daily as before.  3. Simvastatin 20 mg every night as before.  4. Coreg 25  mg p.o. b.i.d.  5. Lasix as prescribed above at a daily dose.  6. Potassium 20 mEq can be provided twice daily and adjusted based on      her basic metabolic panel in the following week.  7. Aspirin 81 mg daily.  8. Lisinopril 20 mg twice daily.  9. Colace 100 mg at bedtime.  10.Mucinex 600 mg twice daily as needed.   I would suggest that she undergo colonoscopy if this can be arranged in  the outpatient setting, especially if follow-up CBC continues to remain  low.      Hettie Holstein, D.O.  Electronically Signed     ESS/MEDQ  D:  06/15/2007  T:  06/15/2007  Job:  [id number]   cc:   Maxwell Caul, M.D.

## 2010-06-07 NOTE — Assessment & Plan Note (Signed)
[facility]                            CARDIOLOGY OFFICE NOTE   [name], [name]                       MRN:          [id number]  DATE:12/18/2006                            DOB:          05-30-1916    INTERVAL HISTORY:  [name] is a very pleasant 77 year old woman with  a history of congestive heart failure with probable nonischemic  cardiomyopathy, EF initially 15% - 20% but more recently it is 40% -  45%.  She has a history of hypertension and hyperlipidemia and mild  dementia.  She has been staying at the [facility],  returns today with her son for routine followup.   From a cardiac point of view she is doing great.  She does have a mild  upper respiratory tract infection but she denies any chest pain or  shortness of breath and no lower extremity edema, no PND, no orthopnea.  She has been getting around pretty good.  Blood pressure has been well-  controlled.  She has been taking Lasix as needed.   CURRENT MEDICATIONS:  1. Aspirin 81.  2. Zoloft 50.  3. Colace 100.  4. Lasix 40 a day.  5. Lisinopril 20 b.i.d.  6. Potassium 10 b.i.d.  7. Digoxin 0.125 mg half tablet a day.  8. Simvastatin 20 a day.  9. Coreg 25 b.i.d.  10.Norvasc 10 a day.   PHYSICAL EXAMINATION:  She is an elderly woman, in no acute distress.  Ambulates around the clinic without any respiratory difficulty.  Blood  pressure is 124/50, heart rate is 80, weight is 159.  HEENT:  Normal.  NECK:  Supple, there is no JVD, carotids are 2+ bilaterally without any  bruits, there is no lymphadenopathy or thyromegaly.  CARDIAC:  PMI is nondisplaced, she has a regular rate and rhythm; no  murmurs, rubs, or gallops.  LUNGS:  Clear.  ABDOMEN:  Soft, nontender, nondistended, good bowel sounds,  hepatosplenomegaly, no bruits, no masses.  EXTREMITIES:  Warm with no cyanosis, clubbing or edema.  NEURO:  Alert and oriented x3, cranial nerves II through XII are  grossly  intact, moves all 4 extremities without difficulty.  Affect is normal.   ASSESSMENT/PLAN:  1. Congestive heart failure, secondary to probably nonischemic      cardiomyopathy.  This is much improved.  She looks euvolemic,      continue current medications.  We will check an echocardiogram to      reevaluate her left ventricular function.  2. Hypertension, blood pressure is well-controlled.  3. Hyperlipidemia, I will check a CMET and lipids today.   DISPOSITION:  Will return to clinic in 3 months for routine followup.     Bevelyn Buckles. Bensimhon, MD  Electronically Signed    DRB/MedQ  DD: 12/18/2006  DT: 12/18/2006  Job #: [id number]

## 2010-06-07 NOTE — H&P (Signed)
[name], [name] NO.:  [id number]   MEDICAL RECORD NO.:  [id number]          PATIENT TYPE:  INP   LOCATION:  3017                         FACILITY:  [facility]   PHYSICIAN:  Hettie Holstein, D.O.    DATE OF BIRTH:  05-08-1916   DATE OF ADMISSION:  06/13/2007  DATE OF DISCHARGE:                              HISTORY & PHYSICAL   PRIMARY CARE PHYSICIAN:  Maxwell Caul, M.D.   FACILITY:  [facility].   CHIEF COMPLAINT:  Chest tightness and dizziness.   HISTORY OF PRESENT ILLNESS:  [name] is a pleasant 77 year old female  with a history of dementia as well as chronic systolic congestive heart  failure followed by a [facility] in the outpatient setting with  last 2-D echocardiogram revealing an ejection fraction of 60% in  December 2008.  In the event, she recently had within the past 4 weeks  an increase in the dose of her diuretics from 40 mg daily to 40 b.i.d.  She is followed by [facility].  In the event, her BNP is 105  today and she does not appear to be in acute decompensated congestive  heart failure and probably more so on the volume depleted side.  Her BUN  is mildly elevated.  We were called and reported as per facility staff  and the patient's son, [name], that she may have had chest tightness in  emergency department.  She was unable to provide history.  She was  unable to recollect any of these events since she does suffer from  dementia.  In the event, EKG was not indicative of ischemia.  Her  initial cardiac markers were also negative.  Her hemoglobin was 7.3 and  her MCV was 66.  She did not exhibit great deal of conjunctival pallor.  We are repeating her hemoglobin and hematocrit to confirm this.  Her son  states that she has not been having diarrhea, and there have been no  reports of dark tarry or bloody stools, though again, [name] was  quite a poor and unaware historian.   PAST MEDICAL HISTORY:   Significant for systolic congestive heart failure  that seems to have improved with therapy.  In the event, she appears to  be compensated.  She is followed by Dr. Gala Romney of the [facility].  Hypertension, hyperlipidemia, dementia, and mild dysphagia.   MEDICATIONS:  This is derived from a list most recently formulated by  the office note on E-chart.  [name] was not sent to the hospital  with a list of her medications.  1. Lasix 40 mg p.o. b.i.d.,  2. Potassium 10 mEq p.o. b.i.d.  3. Aspirin 81 mg daily.  4. Colace 100 mg daily  5. Lisinopril 20 mg p.o. b.i.d.  6. Simvastatin 20 mg daily.  7. Coreg 25 mg b.i.d.  8. Sertraline 100 mg daily.   REVIEW OF SYSTEMS:  She has had some urinary infections.  Most recently,  she had an urinalysis that was negative.  This was collected on this  past Friday.  Swelling of  her lower extremities, that has done well with  increased diuresis.   FAMILY HISTORY:  Noncontributory.   SOCIAL HISTORY:  The patient does have a healthcare power of attorney,  her son, who accompanies her today.  She does have a DNR as per our  discussion today.   REVIEW OF SYSTEMS:  As described above; otherwise, 12 systems reviewed  were unremarkable or history was unobtainable.   PHYSICAL EXAMINATION:  VITAL SIGNS:  In the emergency department, her  vital signs are as follows; blood pressure 110/48, heart rate 76, and O2  saturation 96% on room air.  HEENT:  Head is normocephalic, atraumatic.  Extraocular muscles are  intact.  NECK:  Supple, nontender.  No lymphadenopathy, thyromegaly, or mass.  CARDIOVASCULAR:  Reveals normal S1 and S2.  LUNGS:  Clear bilaterally.  ABDOMEN:  Soft, nontender without rebound or guarding.  LOWER EXTREMITIES:  Reveal no edema, no calf tenderness.  NEUROLOGIC:  Reveals her to be pleasantly confused.  She is moving all 4  extremities spontaneously without difficulty.   LABORATORY DATA:  As noted above.  Hemoglobin on CBC  was low at 7.3.  There was no repeat, MCV was 66, and BNP was 105.  Cardiac markers were  negative.  Sodium was 136, potassium 4.8, BUN 36, creatinine 1.26, and  glucose 100.  LFTs within normal limits with exception of albumin 3.1.  Chest x-ray was unremarkable.  EKG revealed normal sinus rhythm.   ASSESSMENT:  1. Chest pain.  2. Dizziness.  3. Compensated systolic heart failure.  4. Dementia.  5. Hypertension.  6. Low hemoglobin.  A repeat lab is being ordered.  She will be      transfused accordingly if her hemoglobin is, in fact, correct.  No      code blue status.   PLAN:  At this time, [name] will be admitted, we will check her  stools, order anemia profile, follow her cycle cardiac markers, and  follow her hemodynamically.  She appears to be compensated with  reference to her heart failure, perhaps a little on the hypovolemic  side, but we will follow her course clinically.      Hettie Holstein, D.O.  Electronically Signed     ESS/MEDQ  D:  06/13/2007  T:  06/14/2007  Job:  [id number]   cc:   Bevelyn Buckles. Bensimhon, MD  Maxwell Caul, M.D.

## 2010-06-07 NOTE — Assessment & Plan Note (Signed)
[facility]                            CARDIOLOGY OFFICE NOTE   [name], Mccarthy                       MRN:          [id number]  DATE:09/13/2006                            DOB:          [birth year]    PRIMARY CARE PHYSICIAN:  Antony Madura, M.D.   INTERVAL HISTORY:  [name] is a very pleasant 77 year old woman with  a history of congestive heart failure with probable non-ischemic  cardiomyopathy.  Her EF was initially 15-20%, but on her most recent  echocardiogram, it was 40-45%.  She also has a history of hypertension,  hyperlipidemia and dementia.  She has been staying at [facility] and returns today for routine followup.   From a cardiac point of view, she is doing quite well.  She denies any  chest pain or shortness of breath.  No lower extremity edema, no PND, no  orthopnea.  Unfortunately, she seems to be having a little bit more  problems with dementia and also significant depression.  She is very  much wanting to go home, but is unable to do it at this point, due to  her dementia and the fact that her house was a split-level facility.  Her son does take her out on many day trips and spends a lot of time  with her.   CURRENT MEDICATIONS:  1. Aspirin 81.  2. Zoloft 50.  3. Colace 100.  4. Lasix 40 a day.  5. Lisinopril 20 b.i.d.  6. Potassium 10 b.i.d.  7. Digoxin 0.125 mg tablets half tablet a day.  8. Simvastatin 20 a day.  9. Coreg 25 b.i.d.  10.Norvasc 5 a day.   PHYSICAL EXAM:  She is an elderly woman, in no acute distress, ambulates  around the clinic slowly, but no respiratory difficulty.  Blood pressure  is 146/62, heart rate of 60, her weight is 153.  HEENT:  Normal.  NECK:  Supple.  There is no JVD.  Carotids are 2+ bilaterally, without  any bruit.  There is no lymphadenopathy or thyromegaly.  CARDIAC:  PMI is nondisplaced.  He has a regular rate and rhythm.  No  obvious murmurs, rubs or  gallops.  LUNGS:  Clear.  ABDOMEN:  Soft, nontender, nondistended.  Good bowel sounds.  No  hepatosplenomegaly, no bruits, no masses.  EXTREMITIES:  Warm with no cyanosis, clubbing or edema.  NEUROLOGIC:  She is alert and oriented times three.  Cranial nerves II  through XII are grossly intact.  Moves all four extremities without  difficulty.  Affect is normal.   ASSESSMENT AND PLAN:  1. Congestive heart failure, secondary to non-ischemic cardiomyopathy.      She has had a significant recovery in her ejection fraction.  She      looks euvolemic.  Continue current therapy.  2. Hypertension.  Blood pressure is persistently elevated.  Increased      Norvasc to 10.  3. Depression.  We will go ahead and increase her Zoloft to 100.  4. Hyperlipidemia.  Will check her CMET and lipids next week.  5. We  will go ahead and stop her Digoxin, as I do not really think she      needs it.   DISPOSITION:  We will see her back in three months for routine followup.     Bevelyn Buckles. Bensimhon, MD  Electronically Signed    DRB/MedQ  DD: 09/13/2006  DT: 09/14/2006  Job #: [id number]   cc:   Antony Madura, M.D.

## 2010-06-07 NOTE — Assessment & Plan Note (Signed)
[facility]                            CARDIOLOGY OFFICE NOTE   [name], [name]                       MRN:          [id number]  DATE:05/01/2007                            DOB:          12/02/1916    PRIMARY CARDIOLOGIST:  Dr. Gala Romney.   The patient is a patient of [facility].  This is a  very pleasant 77 year old white female patient with a history of  congestive heart failure,  with probable nonischemic cardiomyopathy.  EF  initially was 50% and dropped to 20%, and most recently in December,  echo shows an EF of 60%.  When Dr. Gala Romney saw her on April 19, 2007,  she still had some fluid overload, and he increased her Lasix to 40 mg  b.i.d. for 3 days.  She continued to have edema, and recently the  nursing home increased her once again to 40 mg b.i.d. for 3 more days,  after calling our office.  She is brought in by her son today, and she  still has right lower extremity edema, and he said when she walks she  gets out of breath.  The patient does not keep her legs elevated during  the day and is not on a low-sodium diet, as far as her son knows, at the  retirement center.   CURRENT MEDICATIONS:  1. Lasix 40 mg b.i.d.  2. Potassium 10 mEq b.i.d.  3. Aspirin 81 mg daily.  4. Colace 100 mg nightly.  5. Lisinopril 20 mg b.i.d.  6. Simvastatin 20 mg daily.  7. Carvedilol 25 mg b.i.d.  8. Amlodipine 10 mg daily.  9. Sertraline 100 mg daily.   PHYSICAL EXAMINATION:  This is a pleasant 77 year old white female in no  acute distress.  Weight is 170, which is up 4 pounds from when she was here 2 weeks ago.  NECK:  Without JVD, HJR, bruit or thyroid enlargement.  LUNGS:  Clear anterior, posterior, and lateral.  HEART:  Regular rate and rhythm at 87 beats per minute.  Normal S1 and  S2.  No murmur, rub, bruit, thrill, or heave noted.  ABDOMEN:  Soft without organomegaly, masses, lesions, or abnormal  tenderness.  EXTREMITIES:  There was +1 edema on the right.  Positive distal pulses.   IMPRESSION:  1. Congestive heart failure with weight gain and ongoing edema.  2. Chronic hypertension, now blood pressure quite low.  3. Hyperlipidemia.   PLAN:  At this time I have opted to stop the patient's amlodipine in  case this is contributing to her edema, and especially with her low  blood pressure.  I will continue the Lasix 40 mg b.i.d. and potassium 20  mEq b.i.d. until she sees Dr. Gala Romney back on April 20.  I have also  ordered T.E.D. hose for her to wear.  I have asked her to keep her legs  elevated during the day and have placed her on a 2 gram sodium diet.  The patient has an appointment to see Dr. Gala Romney April 20, and they  are to call if she has significant weight loss  or hypotension on the  increased Lasix.      [name] Reedy, PA-C  Electronically Signed      Noralyn Pick. Eden Emms, MD, [facility]  Electronically Signed   ML/MedQ  DD: 05/01/2007  DT: 05/01/2007  Job #: [id number]

## 2010-06-07 NOTE — Assessment & Plan Note (Signed)
[facility]                            CARDIOLOGY OFFICE NOTE   [name], [name]                       MRN:          [id number]  DATE:02/11/2008                            DOB:          05/26/1916    INTERVAL HISTORY:  [name] is a delightful 77 year old woman with a  history of congestive heart failures due to probable nonischemic  cardiomyopathy, EF was about 20%, right now normalized to 60%.  She also  has a history of hypertension, hyperlipidemia, and iron-deficiency  anemia, for which she has refused endoscopy.   She returns today for somnolence routine followup.  She continues to  live at the [facility].  She is doing well.  She does  have occasional dyspnea, but no chest pain.  No orthopnea or PND.  No  lower extremity edema.  She has been compliant with all her medications.  Recent labs looks great.  Renal function is normal.  Her white counts  were also normal.   CURRENT MEDICATIONS:  1. Colace.  2. Zoloft 100 a day.  3. Potassium 20 b.i.d.  4. Lisinopril 20 a day.  5. Aspirin 81 a day.  6. Coreg 25 b.i.d.  7. Simvastatin 20 a day.  8. Iron 325 a day.  9. Lasix 40 a day.   PHYSICAL EXAMINATION:  GENERAL:  She is a well appearing, no acute  distress, ambulates around the clinic slowly without respiratory  difficulty.  VITAL SIGNS:  Blood pressure is 116/58, heart rate 73, weight is 170,  which is stable.  HEENT:  Normal.  NECK:  Supple.  No JVD.  Carotids are 2+ bilaterally without bruits.  There is no lymphadenopathy or thyromegaly.  CARDIAC:  PMI is nondisplaced.  Regular rate and rhythm.  No murmurs,  rubs, or gallops.  LUNGS:  Clear.  ABDOMEN:  Soft, nontender, and nondistended.  No hepatosplenomegaly.  No  bruits.  No masses.  Good bowel sounds.  EXTREMITIES:  Warm with no  cyanosis, clubbing, or edema.  NEUROLOGIC:  Alert and oriented x3.  Cranial nerves II-XII are intact.  Moves all 4 extremities  without difficulty.   ASSESSMENT AND PLAN:  1. Congestive heart failure.  She is now has normalized ejection      fraction.  She is doing well.  Continue current therapy.  Volume      status looks good.  2. Hypertension.  Blood pressure looks great.   DISPOSITION:  She is doing wonderfully.  We will see her back for  routine in followup in 3-4 months.     Bevelyn Buckles. Bensimhon, MD  Electronically Signed    DRB/MedQ  DD: 02/11/2008  DT: 02/12/2008  Job #: [id number]

## 2010-06-07 NOTE — Assessment & Plan Note (Signed)
[facility]                                 ON-CALL NOTE   MANIYAH, MOLLER                       MRN:          [id number]  DATE:07/01/2006                            DOB:          [birth year]    Telephone Conversation   I received a page through the answering service from Karn Cassis at  [facility] at [phone].   The patient with fever of 101.5.  I returned the call to Ms. Perdue.  She stated she is calling about Ms. Headlee having a fever and requesting  a p.r.n.  order for Tylenol for fever. Ms. Vedia Pereyra states that Ms.  Tung son would be taking her to Urgent Care on Sunday for evaluation  but needed Tylenol to get her through the night. I gave her verbal order  for Tylenol use 650 mg to 1000 mg p.o. q.8 h. p.r.n.  and confirmed that  patient would be seen the following day or sooner if her status changed.  Ms. Vedia Pereyra stated Ms Bakke is a patient of Dr Gala Romney.     Dorian Pod, ACNP  Electronically Signed    MB/MedQ  DD: 07/01/2006  DT: 07/01/2006  Job #: [id number]

## 2010-06-07 NOTE — Assessment & Plan Note (Signed)
[facility]                            CARDIOLOGY OFFICE NOTE   [name], [name]                       MRN:          [id number]  DATE:05/13/2007                            DOB:          1916-07-27    INTERVAL HISTORY:  [name] is a delightful 77 year old woman with  history of congestive heart failure with a probable nonischemic  cardiomyopathy, was initially about 20% but more recently is in the 40-  45% range.  She  has a history of hypertension, hyperlipidemia and mild  dementia.  She has been staying at the [facility].  She returns today with her son for routine follow-up.   She was recently seen by Wende Bushy, [facility], as a walk-in about a week  ago for some lower extremity edema.  Her Lasix was doubled to b.i.d. and  she was asked to wear a TED hose.   She has not been compliant with the TED hose as she says they are  comfortable.  Her weight is down about 2 pounds.  She says her breathing  feels great.  She still has some very mild edema in the low right lower  extremity but none in the left.  No orthopnea, PND, no chest pain.   CURRENT MEDICATIONS:  1. Lasix 40 b.i.d.  2. Potassium 10 b.i.d.  3. Aspirin 81.  4. Colace 100 a day.  5. Lisinopril 20 b.i.d.  6. Simvastatin 20 a day.  7. Coreg 25 b.i.d.  8. Sertraline 100 a day.  9. She is finishing up some Cipro for a UTI.   PHYSICAL EXAMINATION:  GENERAL APPEARANCE:  She is well-appearing, in no  acute distress, ambulates around the clinic slowly without any  respiratory difficulty.  VITAL SIGNS:  Blood pressure is 122/66, heart rate 72, weight 168.  HEENT:  Normal.  NECK:  Supple.  No obvious JVD.  Carotid 2+ bilaterally without bruits.  There is no lymphadenopathy or thyromegaly.  CARDIAC:  PMI is nondisplaced.  She is regular with no murmurs, rubs or  gallops.  LUNGS:  Clear.  ABDOMEN:  Soft, nontender, nondistended, no hepatosplenomegaly, no  bruits, no  masses.  Good bowel sounds.  EXTREMITIES:  Warm with no cyanosis or clubbing.  There is trace edema  on the right, none on the left.  NEURO:  Alert and oriented x3.  Cranial nerves II-XII are intact.  Moves  all four extremities without difficulty.   ASSESSMENT/PLAN:  1. Congestive heart failure secondary to mild systolic dysfunction.      She is doing quite well.  I think her volume status looks great.      We will continue her on Lasix 40 b.i.d.  I told her she can skip      the TED hose as long as she keeps her legs elevated.  We will get a      BMET and a BNP to make sure her renal function is stable.  Will see      her back for routine follow-up in 2 months.  2. Hypertension.  Blood pressure is well-controlled.  Her Norvasc was      stopped at her last visit due to the fact that it might be      contributing to her lower extremity edema.  We will need to watch      her blood pressure very closely.   DISPOSITION:  Return to clinic in 2 months for follow-up.     Bevelyn Buckles. Bensimhon, MD  Electronically Signed    DRB/MedQ  DD: 05/13/2007  DT: 05/13/2007  Job #: [id number]

## 2010-06-07 NOTE — Assessment & Plan Note (Signed)
[facility]                            CARDIOLOGY OFFICE NOTE   [name], [name]                       MRN:          [id number]  DATE:07/13/2006                            DOB:          05-22-1916    INTERVAL HISTORY:  [name] is a delightful 77 year old woman with a  history of congestive heart failure secondary to presumed ischemic  cardiomyopathy with an EF initially 15-20% but now up to 40-45%.  She  also has a history of hypertension and hyperlipidemia.  She presents  today with her son for routine followup.   She has been staying at the [facility] and actually  doing quite well.  Unfortunately, she did develop some fevers and a  cough recently and was seen at urgent care and diagnosed with pneumonia.  She was put on Avelox and has responded very nicely.  She continues to  walk around the center slowly without any problems.  She has not had any  problems with chest pain, orthopnea, PND, or lower extremity edema.  Blood pressures at her center have been quite elevated with systolics in  the 140 to 180 range.   CURRENT MEDICATIONS:  1. Aspirin 81.  2. Zoloft 50.  3. Colace 100.  4. Lasix 40 a day.  5. Lisinopril 20 b.i.d.  6. Digoxin 0.625 a day.  7. Simvastatin 20 a day.  8. Coreg 25 b.i.d.   PHYSICAL EXAMINATION:  GENERAL:  She is an elderly woman in no acute  distress.  She continues to improve and is much more alert and energetic  today.  There is no respiratory distress.  VITAL SIGNS:  Blood pressure 150/68, heart rate 60.  Weight is 150,  which is down 3 pounds from previous.  HEENT:  Normal.  NECK:  Supple.  No JVD.  Carotids are 2+ bilaterally without bruits.  There is no lymphadenopathy or thyromegaly.  CARDIAC:  PMI is nondisplaced.  She has a regular rate and rhythm with  an S4.  No murmur, no S3.  LUNGS:  Clear.  ABDOMEN:  Soft, nontender, nondistended.  No hepatosplenomegaly.  No  bruits.  No  masses.  Good bowel sounds.  EXTREMITIES:  Warm with no clubbing, cyanosis, or edema.  No rash.  NEURO:  Alert and oriented x3.  Cranial nerves II-XII are intact.  Moves  all four extremities without difficulty.  Affect is bright.   ASSESSMENT/PLAN:  1. Congestive heart failure:  Initially, I thought this was likely due      to an ischemic cardiomyopathy; however, given her rapid improvement      in her ejection fraction, I wonder whether or not she may have had      a nonischemic/viral myopathy.  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      Nevertheless, she is doing quite well.  She is on an excellent      medical regimen, and we will continue this.  Her volume status      looks great.  She is  not interested in a defibrillator, and given      her EF of over 40%, would not qualify.  2. Hypertension:  This is suboptimally controlled.  Will add Norvasc 5      a day.   DISPOSITION:  Return to clinic in several months for a routine followup.     Bevelyn Buckles. Bensimhon, MD  Electronically Signed    DRB/MedQ  DD: 07/13/2006  DT: 07/13/2006  Job #: [id number]   cc:   [facility]

## 2010-06-07 NOTE — Assessment & Plan Note (Signed)
[facility]                            CARDIOLOGY OFFICE NOTE   [name], [name]                       MRN:          [id number]  DATE:04/19/2007                            DOB:          1916-02-14    INTERVAL HISTORY:  [name] is a very pleasant, 77 year old woman with  a history of congestive heart failure, with a probable non-ischemic  cardiomyopathy.  EF is initially 50% to 20% but more recently it is in  the 40% to 45% range.  She has a history of hypertension, hyperlipidemia  and mild dementia.  She has been staying at the [facility].  She returns today with her son for routine follow-up.   She denies a complaints.  She says she is doing great.  However, her son  has noticed that she has been having some swelling, and she is more  dyspneic when she walks with him.  She has not had any orthopnea or PND.  She has been taking Lasix 40 mg a day.   CURRENT MEDICATIONS:  1. Aspirin 81.  2. Colace 100 a day.  3. Lasix 40 a day.  4. Lisinopril 20 a day.  5. Potassium 10 b.i.d.  6. Simvastatin 20 a day.  7. Carvedilol 25 b.i.d.  8. Amlodipine 10 a day.  9. Sertraline a 100 a day.   PHYSICAL EXAMINATION:  GENERAL:  She is in no acute.  She ambulates  around the clinic without any respiratory difficulty.  VITAL SIGNS:  Blood pressure is 110/50, weight is 166, which is up about  7 or 8 pounds for her.  HEENT:  Normal.  NECK:  Supple.  JVP is about 7 cm, 7 to 8 cm of water.  Carotids are 2+  bilaterally without any bruits.  There is no lymphadenopathy or  thyromegaly.  CARDIAC:  PMI is nondisplaced, irregular rate and rhythm.  No murmurs,  rubs or gallops.  LUNGS:  Clear.  ABDOMEN:  Soft, nontender, mildly distended, good bowel sounds.  No  hepatosplenomegaly, no bruits, no masses appreciated.  EXTREMITIES:  Warm with no cyanosis or clubbing.  There is a trace to 1+ edema, right  greater than left.  NEURO:  Alert,  oriented x3.  Cranial nerves II-XII grossly intact.  Moves all four extremities without difficulty.  Affect is normal.   ASSESSMENT/PLAN:  1. Congestive heart failure.  Her ejection fraction is much improved;      however, she does have some volume overload today, will increase      her Lasix to 40 b.i.d. x3 days, and then I have instructed her son      on how to use sliding-scale Lasix at [facility].  We      will get labs today as well as next week, to make sure her renal      function is stable.  2. Chronic hypertension.  Blood pressure is well-controlled.  3. Hyperlipidemia.  She will get lipids today, as well as a lipid      panel.   DISPOSITION:  Will see her back  in 4 to 6 weeks for followup.     Bevelyn Buckles. Bensimhon, MD  Electronically Signed    DRB/MedQ  DD: 04/19/2007  DT: 04/20/2007  Job #: [id number]

## 2010-06-10 NOTE — Discharge Summary (Signed)
NAMEADRIENE, [name]                ACCOUNT NO.:  [id number]   MEDICAL RECORD NO.:  [id number]          PATIENT TYPE:  INP   LOCATION:  3713                         FACILITY:  [facility]   PHYSICIAN:  Hillery Aldo, M.D.   DATE OF BIRTH:  05-Dec-1916   DATE OF ADMISSION:  03/06/2006  DATE OF DISCHARGE:  03/16/2006                               DISCHARGE SUMMARY   PRIMARY CARE PHYSICIAN:  Dr. Burton Apley.   CARDIOLOGIST:  Dr. Gala Romney.   DISCHARGE DIAGNOSES:  1. Acute systolic congestive heart failure.  2. Escherichia coli urinary tract infection.  3. Dementia.  4. Hypokalemia.  5. Acute renal failure secondary to overdiuresis, resolved.  6. Bronchitis, resolved.  7. Mild dysphagia with flash laryngeal penetration without evidence of      aspiration.  8. Mild pulmonary hypertension.   DISCHARGE MEDICATIONS:  1. Aspirin 81 mg daily.  2. Zoloft 50 mg daily.  3. Coreg 12.5 mg b.i.d., hold for systolic pressure less than 100 or      heart rate less than 60.  4. Colace 100 mg nightly, hold for diarrhea.  5. Lasix 40 mg b.i.d.  6. Digoxin 0.62 mg daily.  7. Lisinopril 20 mg b.i.d.  8. Potassium chloride 20 mEq elixir t.i.d.  9. Ensure supplement b.i.d.   CONSULTATIONS:  Dr. Gala Romney of Cardiology.   BRIEF ADMISSION/HISTORY OF PRESENT ILLNESS:  The patient is an 77-year-  old female who developed dyspnea on exertion progressive over 6 weeks.  She was put on Lasix by her primary care physician but then  approximately 1 week prior to presentation developed upper respiratory  symptoms with increasing congestion and cough.  The patient was  therefore admitted for further evaluation and workup.  For the full  details of the HPI, please see the dictated report done by Dr. [name].   PROCEDURES AND DIAGNOSTIC STUDIES:  1. Chest x-ray on 03/06/2006 showed small right pleural effusion but      was improved when compared to films done on 01/03/2006.  2. CT scanning of the head on  03/07/2006 showed no acute intracranial      abnormalities.  There was extensive chronic microvascular ischemic      changes noted.  3. Two-view films of the chest on 03/09/2006 showed cardiac enlargement      and vascular congestion, it appears improved from prior x-ray.      There was interval increase in right effusion and right lower lobe      air space disease which was thought possibly to be due to heart      failure or pneumonia.  4. Two views of the chest on 03/13/2006 showed slightly better aeration      with persistent small effusions and cardiomegaly.  5. Two-D echocardiogram on 03/07/2006 showed severely reduced left      ventricular systolic function with an ejection fraction of 15-20%      and severe diffuse left ventricular hypokinesis.  The left      ventricular wall thickness was mildly increased.  Apical thrombus      could not be completely ruled  out.  Aortic valve thickness was      mildly increased.  There was mild mitral annular calcification and      mild mitral valvular regurgitation.  The left atrium was mildly      dilated.  Right ventricular systolic function was mildly reduced.      The estimated peak pulmonary artery systolic pressure was mildly      increased.  There was moderate tricuspid valvular regurgitation and      the right atrium was mildly dilated.  6. A swallowing study done on 03/13/2006 showed flash laryngeal      penetration without aspiration particularly when using straws and      sips of thin liquids.  There was a narrow lower esophageal      sphincter.  Recommendations were to continue with a regular diet,      thin liquids, and oral care b.i.d.  She should have intermittent      supervision with meals, be seated upright 90 degrees for all p.o.      intake, and to avoid straws.  She is to follow solids with liquids.   HOSPITAL COURSE BY PROBLEM:  1. Acute systolic congestive heart failure:  The patient's dyspnea was      felt to be  multifactorial but the larger component was certainly      felt to be due to congestive heart failure.  Given this, a      cardiology consultation was requested and kindly provided by Dr.      Gala Romney.  The patient did undergo 2-D echocardiogram testing with      findings as noted above.  The patient and the patient's family did      not want to pursue an invasive diagnostic workup and so attention      was therefore directed towards optimal medical management.  The      patient's medical therapies were optimized however she developed      acute renal failure with overdiuresis.  At this point, she is      optimally medically managed and she should have her renal function      followed closely.  Her Lasix dosage may need to be changed to 40 mg      if her renal function worsens over time.  She should probably have      a BMET done in the morning and then early next week to ascertain      her renal function.  She should have ongoing dose titration of her      Coreg slowly as tolerated under the direction of the cardiologist      through the Heart Failure Clinic.  2. Escherichia coli urinary tract infection:  The patient was      adequately treated with antibiotics and her followup urinalysis      does not reveal any persistent pyuria.  Followup cultures were      negative.  3. Dementia.  The patient does have microvascular changes on CT scan      and dementia which is likely vascular in origin.  She is currently      stable.  4. Hypokalemia.  The patient's hypokalemia is secondary to diuretics.      She was put on appropriate potassium supplement and has maintained      her potassium with discharge potassium value of 3.5.  5. Acute renal failure:  The patient's renal failure has completely      resolved and  was felt to be secondary to overdiuresis.  6. Bronchitis. The patient has completed a course of antibiotic      therapy with Avelox.  Her bronchitis symptoms have completely      resolved.  She was treated for a full 7-day course.  7. Dysphagia. The patient was seen in consultation with her speech      therapist with finding on swallowing evaluation as noted above.      Recommendations were as noted above.   DISPOSITION:  The patient is stable for discharge to [facility].  Again, she should have a BMET drawn tomorrow for followup of  her renal function.  She should also have regular surveillance of her  BNP level.  She should follow up in the CHF Clinic on 03/23/2006 at 10:15  with Dr. Gala Romney.  She should bring all of her medications to her  appointments or a list of her current medications.   DISCHARGE LABORATORY VALUES:  Sodium was 141, potassium 3.5, chloride  103, bicarbonate 32, BUN 17, creatinine 0.85, glucose 102.  BNP was 1749  (down from greater than 3200 forty-eight hours ago), digoxin level was  less than 0.2.      Hillery Aldo, M.D.  Electronically Signed     CR/MEDQ  D:  03/16/2006  T:  03/16/2006  Job:  [id number]   cc:   Antony Madura, M.D.  Bevelyn Buckles. Bensimhon, MD

## 2010-06-10 NOTE — Assessment & Plan Note (Signed)
[facility]ALTHCARE                            CARDIOLOGY OFFICE NOTE   [name], Mccarthy                       MRN:          [id number]  DATE:04/03/2006                            DOB:          1916/12/01    PRIMARY CARE PHYSICIAN:  Antony Madura, M.D.   INTERVAL HISTORY:  [name] is a very pleasant 77 year old woman with  recently diagnosed acute systolic heart failure with an EF of 15-20%.  She also has history of mild dementia.  She returns today with her son  for routine followup.  She has been at [facility] over the past week or  two.  She has done much better.  She has been moved from the skilled  care part to assisted living.  She is beginning to ambulate more around  the halls.  She denies dyspnea.  No chest pain, no lower extremity  edema.  She has been compliant with all her medications.   CURRENT MEDICATIONS:  1. Aspirin 81.  2. Zoloft 50.  3. Coreg 12.5 b.i.d.  4. Colace 100 at night.  5. Lasix 40 a day.  6. Lisinopril 20.  7. Potassium 20 t.i.d.  8. Digoxin 0.625 mg daily.   PHYSICAL EXAMINATION:  GENERAL:  She is an elderly woman in no acute  distress.  She seems much more alert than previous.  Ambulates around  the clinic slowly but with no respiratory difficulty.  VITAL SIGNS:  Blood pressure is 112/52, weight is 150, heart rate is 75.  HEENT:  Sclerae anicteric, EOMI.  There are no xanthelasmas.  Mucous  membranes are moist.  Oropharynx is clear.  NECK:  Supple with no obvious JVD.  Carotids are 2+ bilaterally without  any bruits.  There is no lymphadenopathy or thyromegaly.  CARDIAC:  Regular rate and rhythm with no S3.  Soft 2/6 systolic  ejection murmur at the left sternal border.  LUNGS:  Clear.  ABDOMEN:  Soft, nontender, nondistended.  No hepatosplenomegaly, no  bruits, no mass.  EXTREMITIES:  Warm with no cyanosis, clubbing or edema.  No rash.  NEUROLOGIC:  She is alert and oriented.  Cranial nerves II-XII are  grossly intact.  She moves all four extremities without difficulty.   ASSESSMENT:  Congestive heart failure secondary to presumed ischemic  cardiomyopathy.  She is doing quite well with medical therapy.  I  currently put her at NYHA class II to III.  I think at this point it  would be reasonable to hold her medications where they are to let her  equilibrate for a month and continue to recover.  Will check an  echocardiogram in the next few weeks to see if her EF has recovered, and  then will see her back in clinic in 3 weeks in hopes of titrating her  medications further.  We will check routine labs to make sure her  digoxin level and potassium and renal function are stable.     Bevelyn Buckles. Bensimhon, MD  Electronically Signed    DRB/MedQ  DD: 04/03/2006  DT: 04/03/2006  Job #: [id number]

## 2010-06-10 NOTE — Assessment & Plan Note (Signed)
[facility]ALTHCARE                            CARDIOLOGY OFFICE NOTE   [name], [name]                       MRN:          [id number]  DATE:03/23/2006                            DOB:          Oct 07, 1916    PRIMARY CARE PHYSICIAN:  Dr. Burton Apley.   INTERVAL HISTORY:  [name] is a very pleasant 77 year old woman who  was recently admitted with acute systolic heart failure.  She was found  to have an ejection fraction of about 15% to 20%.  She also has a  history of dementia.  While in the hospital she did have some problems  with hypotension and acute renal failure secondary to over-diuresis, but  this has resolved.  She was discharged to [facility].  Her son, who  spends a lot of time with her, says she remains quite weak.  She has  lost about 15 pounds over the past 2 weeks.  She denies any chest pain  or shortness of breath, but he says her activities are very limited, she  just gets up and down to the commode.   PHYSICAL EXAMINATION:  This is an elderly woman in no acute distress.  She ambulates around the room slowly without any respiratory difficulty.  Blood pressure is 92/50, heart rate is 83, weight is 150.  HEENT:  Sclerae are anicteric, EOMI.  There is no xanthelasma.  Mucous  membranes are moist.  NECK:  Supple, no JVD.  Carotids are 2+ bilaterally without any bruits.  There is no lymphadenopathy or thyromegaly.  CARDIAC:  She has distant heart sounds, she is regular, there is no  obvious S3, no murmur.  LUNGS:  Clear.  ABDOMEN:  Obese, nontender, nondistended.  No hepatosplenomegaly, no  bruits, no masses, good bowel sounds.  EXTREMITIES:  Warm with no cyanosis, clubbing or edema.  NEUROLOGIC:  She is alert, she seems to be oriented to person.  She  thinks the year is 58.  Cranial nerves II-XII are intact.  She moves  all 4 extremities without difficulty.  Affect is very pleasant.   ASSESSMENT AND PLAN:  Congestive heart failure  secondary to systolic  dysfunction.  At this point I suspect she may be over-diuresed with her  low blood pressure and her weight down as much as it is.  We will check  labs today.  We will cut her Lasix back to 40 once a day.  Given her  hypertension, I do not feel that we can adequately titrate any of her  other medications.  We had a long talk with her and her son previously  about a possible invasive workup, and they are uninterested in this.  We  will contact [facility] should her labs be significantly perturbed.  We  will see her back in several weeks for routine followup.    [name] Buckles. Bensimhon, MD  Electronically Signed   DRB/MedQ  DD: 03/23/2006  DT: 03/24/2006  Job #: [id number]   cc:   Antony Madura, M.D.

## 2010-06-10 NOTE — H&P (Signed)
**Note [name]-Identified via Obfuscation** [name], Mccarthy                ACCOUNT NO.:  [id number]   MEDICAL RECORD NO.:  [id number]          PATIENT TYPE:  EMS   LOCATION:  MAJO                         FACILITY:  [facility]   PHYSICIAN:  Mobolaji B. Bakare, M.D.DATE OF BIRTH:  10-18-1916   DATE OF ADMISSION:  03/06/2006  DATE OF DISCHARGE:                              HISTORY & PHYSICAL   PRIMARY CARE PHYSICIAN:  Burton Apley, MD.   CHIEF COMPLAINT:  Shortness of breath.   HISTORY OF PRESENTING COMPLAINT:  Mrs. [name] is a 77 year old Caucasian  female who developed dyspnea on exertion about six weeks ago.  She was  seen by her primary care physician who initiated Lasix. About a week  ago, she developed upper respiratory symptoms with congestion and cough.  This has progressed according to the patient's son.  There is no  associated fever.  Cough is nonproductive of sputum.  Today her son was  called by neighbors that the patient has not picked up her mail and he  went over to her house and noticed that she was lying naked on the  floor.  She was confused.  It was unclear if the patient fell or not.  She was brought to the emergency room by EMS. On arrival by EMS, the  patient was noted to be wheezing.  She received nebulizer treatments and  symptoms appeared a little bit better.   The patient was unable to give reliable history because of dementia.  She cannot recall what happened.   REVIEW OF SYSTEMS:  She denies chest pain, abdominal pain, nausea,  vomiting, diarrhea.  No fever or chills.   PAST MEDICAL HISTORY:  1. CHF.  2. Hypertension.  3. Depression.   PAST SURGICAL HISTORY:  Hysterectomy.   CURRENT MEDICATIONS:  1. Labetalol 200 mg b.i.d.  2. Lisinopril 40 mg daily.  3. Sertraline 50 mg daily.  4. Nifedipine XL 60 mg daily.  5. Micardis 40 mg daily.  6. Lasix 40 mg daily.   ALLERGIES:  No known drug allergies.   FAMILY HISTORY:  Noncontributory.   SOCIAL HISTORY:  The patient never smoked  cigarettes.  She does not  drink alcohol.  She lives alone.   PHYSICAL EXAMINATION:  VITAL SIGNS:  Temperature 99.2, blood pressure  182/107, blood pressure is 158/70.  Pulse initially was 120, now is 99.  Respiratory rate 18.  Oxygen saturation of 99% on oxygen.  GENERAL:  On examination, the patient is comfortable lying down flat.  Not in respiratory distress.  HEENT:  Normocephalic, atraumatic head.  Pupils equal, round, and  reactive to light.  Mucous membranes moist.  NECK:  No elevated JVD.  No carotid bruits.  LUNGS:  Bilateral expiratory rhonchi, diminished air entry at the lung  bases.  CARDIOVASCULAR:  S1 and S2, regular.  ABDOMEN:  Nondistended, soft, nontender.  Bowel sounds present.  EXTREMITIES:  Bilateral pitting pedal edema with right lower extremity  more swollen than the left.  No cyanosis.  CNS:  No focal deficit.  Cranial nerve intact.   INITIAL LABORATORY DATA:  White cells 11.9, hemoglobin 13.3, hematocrit  41.9, MCV 82, platelets 246.  Neutrophils 83%, lymphocytes 4%.  BNP 313.  PT 19.3, INR 1.6.  Cardiac markers: Myoglobin greater than 500.  Creatinine 1.9, sodium 140, potassium 3.7, chloride 109, glucose 123,  BUN 25.  Blood gas:  PH 7.37, pCO2 36.7, pO2 160, bicarb 21.5.  Oxygen  saturation 99%.  This is on oxygen.  It is unclear what level of oxygen.  Chest x-ray shows small right pleural effusion without infiltrates.  EKG  shows sinus tachycardia with heart rate of 113.  It is essentially  unchanged from old EKG in October 2007.   ASSESSMENT/PLAN:  1. Acute bronchitis.  2. Congestive heart failure with elevated BNP and small pleural      effusion.  3. Dementia.  4. Hypertension.  5. Depression.  6. Right lower extremity swelling more than left.  7. Elevated INR of unclear etiology.   PLAN:  Will admit to telemetry.  Nebulize with albuterol and Atrovent  around the clock and p.r.n.  Change Lasix to IV 40 mg daily and monitor  BMET.  Obtain 2-D  echocardiogram.  Cycle cardiac enzymes q.8h. x3.  Will  resume labetalol, lisinopril and Micardis.  Zithromax 250 mg p.o. daily  for five days.  Head CT scan to rule out intracranial bleed.  Obtain  lower extremity Dopplers.  Rule out DVT.  Check liver function tests and  repeat PT/INR.  The patient will need skilled nursing facility  placement.  I do not think she can continue to live by herself for  safety reasons.  Will check TSH, vitamin B12, folate, and RPR.      Mobolaji B. [name], M.D.  Electronically Signed     MBB/MEDQ  D:  03/07/2006  T:  03/07/2006  Job:  [id number]   cc:   Antony Madura, M.D.

## 2010-06-10 NOTE — Consult Note (Signed)
NAMEVELETA, YAMAMOTO                ACCOUNT NO.:  [id number]   MEDICAL RECORD NO.:  [id number]          PATIENT TYPE:  INP   LOCATION:  3713                         FACILITY:  [facility]   PHYSICIAN:  Bevelyn Buckles. Bensimhon, MDDATE OF BIRTH:  1916-03-06   DATE OF CONSULTATION:  03/07/2006  DATE OF DISCHARGE:                                 CONSULTATION   REASON FOR CONSULTATION:  Heart failure.   HISTORY OF PRESENT ILLNESS:  Ms. Mikel is an 77 year old woman with a  history of hypertension and mild dementia. There is a questionable  history of CHF but she denies ever having a previous cardiac evaluation.  Over the past 6 weeks she has had episodes of shortness of breath and  chest congestion. She denies any associated chest pain, palpitations,  orthopnea or PND. She went to her primary care doctor who apparently got  a chest x-ray and saw congestive heart failure.  She was started on  Lasix with some dose titration; she improved, however the past few days  has had increasing shortness of breath. Apparently her son found her  lying naked on the floor of her home where she lives alone. She was  conscious but confused. She was brought to the emergency room where a  head CT was negative. She had an echocardiogram which shows an EF of 15-  20%. Her cardiac enzymes had just been minimally elevated. Currently she  says she feels much better. She is able to lie flat in bed and denies  any orthopnea or ongoing shortness of breath.   REVIEW OF SYSTEMS:  Notable for altered mental status, dementia,  weakness, shortness of breath, dyspnea on exertion, lower extremity  edema as well as some cough and wheezing, as well as arthritis pain. No  bright red blood per rectum. No chest pain. No fevers, no chills. No  melena. Remainder of the systems is negative except as in HPI and  problem list.   PAST MEDICAL HISTORY:  1. Hypertension.  2. Dementia.  3. Depression.  4. Status post hysterectomy.  5.  History of elevated INR of unclear etiology.   CURRENT MEDICATIONS:  1. Albuterol inhaler q.6 hours.  2. Aspirin 81 daily.  3. Zithromax.  4. Enoxaparin 40 daily.  5. Lasix 40 mg IV daily.  6. Atrovent inhaler.  7. Avapro 150 daily.  8. Labetalol 200 b.i.d.  9. Lisinopril 40 daily.  10.Procardia 60 daily.  11.Zoloft 50 daily.   ALLERGIES:  No known drug allergies.   SOCIAL HISTORY:  She lives in [place] alone. She is retired and is a  widow. Denies any tobacco or alcohol.   FAMILY HISTORY:  Mother died at 72 due to stroke. Father died at 30 of  unknown reasons.   PHYSICAL EXAMINATION:  GENERAL APPEARANCE: An elderly woman lying flat  in bed in no acute distress.  Respirations are unlabored. She is able to  follow commands and have an appropriate dialog.  VITAL SIGNS: Blood pressure is 98/67, heart rate is 73, she is  saturating 95% on room air. Temperature is 97.5.  HEENT: Sclerae are  anicteric. Extraocular movements are intact. No  xanthelasma. Mucous membranes are moist. Oropharynx is clear.  NECK: Supple, JVP is elevated to the level of the jaw. Carotids are 2+  bilaterally, no bruits. There is no lymphadenopathy or thyromegaly.  CARDIAC: There are very distant heart sounds. She is regular with no  obvious murmurs, rubs, or gallops.  LUNGS: Have mild rhonchi throughout but no wheezes.  ABDOMEN: Soft, nontender, nondistended, she has mild hepatomegaly, there  is no splenomegaly, no rebound or guarding, no distention, good bowel  sounds.  EXTREMITIES: Are warm with no cyanosis or clubbing, there is 2+ edema on  the right and 1+ on the left, 1+ distal pulses.  NEURO: She is alert and oriented. Cranial nerves II-XII grossly intact,  moves all four extremities without difficulty. Affect is appropriate.   Chest x-ray shows small pleural effusion with no overt CHF.  Head CT  showed extensive microvascular changes but no acute abnormalities.  EKG shows normal sinus rhythm  with a rate of 70 with some mild T wave  inversions laterally which are new from previous.   LABORATORY DATA:  White count 11,900, hemoglobin 15.6, platelets of  246,000. Sodium 139, potassium 4.3, chloride 105, bicarb of 23, BUN of  25, creatinine 1.12.  Cardiac markers: CK is 187 with an MB of 7.6, troponin is 0.13; second  set shows CK is 165, MB of 6.2, troponin of 0.13, BNP is 313. TSH is  within normal limits.   ASSESSMENT AND PLAN:  Ms. Farabaugh is an 77 year old woman with  hypertension and dementia who is admitted with acute systolic heart  failure with an ejection fraction of 15-20%.  I had a long discussion  about possibilities with her family and they do not want to pursue an  invasive work-up at this time. Will continue diuresis for her volume  overload. Will change her Labetalol over to Coreg and continue her ACE  inhibitor and ARB. We can add digoxin as needed. I suspect she will need  placement and be unable to continue living alone.      Bevelyn Buckles. Bensimhon, MD  Electronically Signed     DRB/MEDQ  D:  03/07/2006  T:  03/07/2006  Job:  [id number]

## 2010-06-10 NOTE — Assessment & Plan Note (Signed)
[facility]                            CARDIOLOGY OFFICE NOTE   VESTAL, CRANDALL                       MRN:          [id number]  DATE:05/24/2006                            DOB:          1916/06/21    INTERVAL HISTORY:  [name] is a delightful 77 -year-old woman with a  history of congestive heart failure secondary to presumed ischemic  cardiomyopathy. EF initially of 15-20%, but on most recent  echocardiogram was 40-45%. She presents today with her son for an  unscheduled visit to evaluate for possible fluid overload.   She has been staying at the [facility] and actually  doing quite well. She is able to walk back and forth to the cafeteria  and outside without any chest pain, no dyspnea, no lower extremity  edema. No orthopnea and PND. Her son notes that over the past week or  two her weight has been up just slightly a pound or two and he was  worried that she might be getting fluid overloaded so he brought her  here today.   CURRENT MEDICATIONS:  1. Aspirin 81.  2. Zoloft 50.  3. Coreg 18.75 b.i.d.  4. Colace 100 mg q nightly.  5. Lasix 40 a day.  6. Lisinopril 20 b.i.d.  7. Potassium 10 b.i.d.  8. Lanoxin 0.0625 a day.  9. Simvastatin 20 a day.   PHYSICAL EXAMINATION:  She is an elderly woman in no acute distress. She  continues to improve and is much more alert and energetic today than  before. There is no respiratory distress. Blood pressure is 154/64,  heart rate 67, weight 153, which is up one pound from previous.  HEENT: Is normal.  NECK: is supple. JVP is about 5-6 cm of water. Carotids are 2+  bilaterally without bruits. There is no lymphadenopathy, thyromegaly.  CARDIAC: Shows a regular rate and a rhythm with S4. No murmur.  LUNGS:  Are clear.  ABDOMEN: Soft, nontender, nondistended. No hepatosplenomegaly. No  bruits. No masses. Good bowel sounds.  EXTREMITIES: Are warm with no cyanosis, clubbing. There  is trivial edema  on the right and none on the left. No rash.  NEURO: She is alert and oriented x3. Moves all 4 extremities without  difficulty. Affect is very pleasant.   ASSESSMENT/PLAN:  1. Congestive heart failure secondary to presumed ischemic      cardiomyopathy. Overall, I think she is doing quite well. Her      volume status actually looks pretty good given her hypertension. We      will go ahead and titrate her Coreg up to 25 b.i.d. and we have      also written an order for p.r.n. Lasix should she gain three pounds      or more in a 24 hour period.  2. Presumed coronary artery disease. She has refused catheterization      and will continue to manage medically.   DISPOSITION:  Return to clinic in one month for routine followup.     Bevelyn Buckles. Bensimhon, MD     DRB/MedQ  DD:  05/24/2006  DT: 05/24/2006  Job #: [id number]

## 2010-06-10 NOTE — Assessment & Plan Note (Signed)
[facility]                            CARDIOLOGY OFFICE NOTE   DRENA, HAM                       MRN:          [id number]  DATE:04/27/2006                            DOB:          21-Nov-1916    INTERVAL HISTORY:  [name] is a delightful, 77 year old woman with a  history of congestive heart failure presumed secondary to ischemic  cardiomyopathy, who returns today with her son for routine followup.  She also has a history of hypertension and mild dementia.   She has been staying at the [facility] and doing quite  well.  She is becoming more and more active.  She can walk now without  getting short of breath.  She denies any chest pain or orthopnea.  No  PND.  Her son has really been taking meticulous care of her.  Her weight  remains stable.  She did have a repeat echocardiogram and her ejection  fraction is now 40 to 45 percent, which is up from 15 percent.   CURRENT MEDICATIONS:  1. Aspirin 81.  2. Zoloft 50.  3. Coreg 12.5 b.i.d.  4. Colace 100.  5. Lasix 40 a day.  6. Lisinopril 20 b.i.d.  7. Potassium 20 t.i.d.  8. Lanoxin 0.125 one-half tablet a day.   PHYSICAL EXAMINATION:  GENERAL:  She is an elderly woman in no acute  distress.  She ambulates around the clinic slowly but with no  respiratory distress.  VITAL SIGNS:  Blood pressure today is actually 164/64, on recheck it was  124/74.  Heart rate is 75.  HEENT:  Sclerae anicteric.  EOMI.  There is no xanthelasma.  Moist  mucous membranes.  Oropharynx is clear.  NECK:  Supple.  No JVD.  Carotids are 2+ bilaterally without any bruits.  There is no lymphadenopathy or thyromegaly.  CARDIAC:  She has a regular rate and rhythm with an S4.  No murmur.  LUNGS:  Clear.  ABDOMEN:  Soft, nontender, nondistended.  There is no  hepatosplenomegaly.  No bruits, no masses.  Good bowel sounds.  EXTREMITIES:  Warm with no cyanosis, clubbing.  There is trivial edema.  No  rash.  NEUROLOGIC:  She is alert and follows command and responds  appropriately.  Moves all four extremities without difficulty.  Affect  is very pleasant.   LABORATORY DATA:  Sodium 141.  Potassium 4.9.  BUN 20.  Creatinine of 1.  LFTs are normal.  Total cholesterol is 183.  Triglycerides 83.  HDL is  53 and LDL is 114.  BNP is 260.   ASSESSMENT/PLAN:  1. Congestive heart failure.  I initially assumed this was due to an      ischemic etiology but she may actually be a nonischemic given the      lack of regional wall motion abnormalities.  Either way, we are      managing her medically and she is doing quite well.  Given her      hypertension, we will increase her Coreg to 18.75 b.i.d.  I did      warn them  that they may need to take an extra dose or two of Lasix      as we go up on the dose.  2. Hyperlipidemia.  We will add simvastatin 20.  3. Electrolytes.  Potassium is at the high end.  We will decrease her      potassium to 20 mg b.i.d.  4. DNR/DNI.  We recertified this form.  5. Disposition:  Return to clinic in one month.  I congratulated her      son and her on the excellent work they are doing.     Bevelyn Buckles. Bensimhon, MD  Electronically Signed    DRB/MedQ  DD: 04/27/2006  DT: 04/28/2006  Job #: [id number]   cc:   [facility]

## 2010-10-19 LAB — CBC
HCT: 23.3 — ABNORMAL LOW
HCT: 28.2 — ABNORMAL LOW
HCT: 30.1 — ABNORMAL LOW
Hemoglobin: 8.8 — ABNORMAL LOW
MCHC: 31.2
MCHC: 31.7
MCV: 71.8 — ABNORMAL LOW
MCV: 72.2 — ABNORMAL LOW
Platelets: 304
Platelets: 335
RDW: 19.8 — ABNORMAL HIGH
RDW: 22.3 — ABNORMAL HIGH
RDW: 22.5 — ABNORMAL HIGH
WBC: 8.3
WBC: 9.3

## 2010-10-19 LAB — IRON AND TIBC
Iron: <10 — ABNORMAL LOW
UIBC: 385

## 2010-10-19 LAB — B-NATRIURETIC PEPTIDE (CONVERTED LAB): Pro B Natriuretic peptide (BNP): 105 — ABNORMAL HIGH

## 2010-10-19 LAB — COMPREHENSIVE METABOLIC PANEL
Albumin: 3.1 — ABNORMAL LOW
Alkaline Phosphatase: 65
BUN: 36 — ABNORMAL HIGH
Calcium: 8.7
Creatinine, Ser: 1.26 — ABNORMAL HIGH
Potassium: 4.8
Total Protein: 6.6

## 2010-10-19 LAB — POCT CARDIAC MARKERS
Myoglobin, poc: 128
Operator id: 281221
Troponin i, poc: <0.05

## 2010-10-19 LAB — DIFFERENTIAL
Basophils Relative: 1
Eosinophils Relative: 2
Lymphocytes Relative: 22
Monocytes Relative: 12
Neutrophils Relative %: 63

## 2010-10-19 LAB — CROSSMATCH
ABO/RH(D): A POS
Antibody Screen: NEGATIVE

## 2010-10-19 LAB — CARDIAC PANEL(CRET KIN+CKTOT+MB+TROPI)
CK, MB: 2.3
Relative Index: 2.5
Troponin I: 0.03

## 2010-10-19 LAB — URINE MICROSCOPIC-ADD ON

## 2010-10-19 LAB — URINALYSIS, ROUTINE W REFLEX MICROSCOPIC
Glucose, UA: NEGATIVE
Ketones, ur: NEGATIVE
Nitrite: NEGATIVE
Specific Gravity, Urine: 1.003 — ABNORMAL LOW
pH: 6.5

## 2010-10-19 LAB — BASIC METABOLIC PANEL
BUN: 22
BUN: 26 — ABNORMAL HIGH
CO2: 27
Chloride: 105
Creatinine, Ser: 1.09
Creatinine, Ser: 1.12
GFR calc non Af Amer: 46 — ABNORMAL LOW
Glucose, Bld: 84
Glucose, Bld: 91
Potassium: 3.3 — ABNORMAL LOW
Potassium: 3.7

## 2010-10-19 LAB — HEMOGLOBIN AND HEMATOCRIT, BLOOD: Hemoglobin: 7 — CL

## 2010-10-19 LAB — RETICULOCYTES: RBC.: 3.6 — ABNORMAL LOW

## 2010-10-19 LAB — PROTIME-INR
INR: 1.2
Prothrombin Time: 15.1

## 2010-10-19 LAB — URINE CULTURE: Special Requests: NEGATIVE

## 2010-10-19 LAB — OCCULT BLOOD X 1 CARD TO LAB, STOOL: Fecal Occult Bld: NEGATIVE

## 2010-10-19 LAB — APTT: aPTT: 35

## 2010-10-19 LAB — VITAMIN B12: Vitamin B-12: 955 — ABNORMAL HIGH (ref 211–911)

## 2011-02-08 IMAGING — CR DG CHEST 2V
2 series · 2 of 2 positions shown · non-contrast
Comparison: 06/13/2007

CLINICAL DATA: Anemia.  Hypotension.

CHEST - 2 VIEW

[w chest pa]
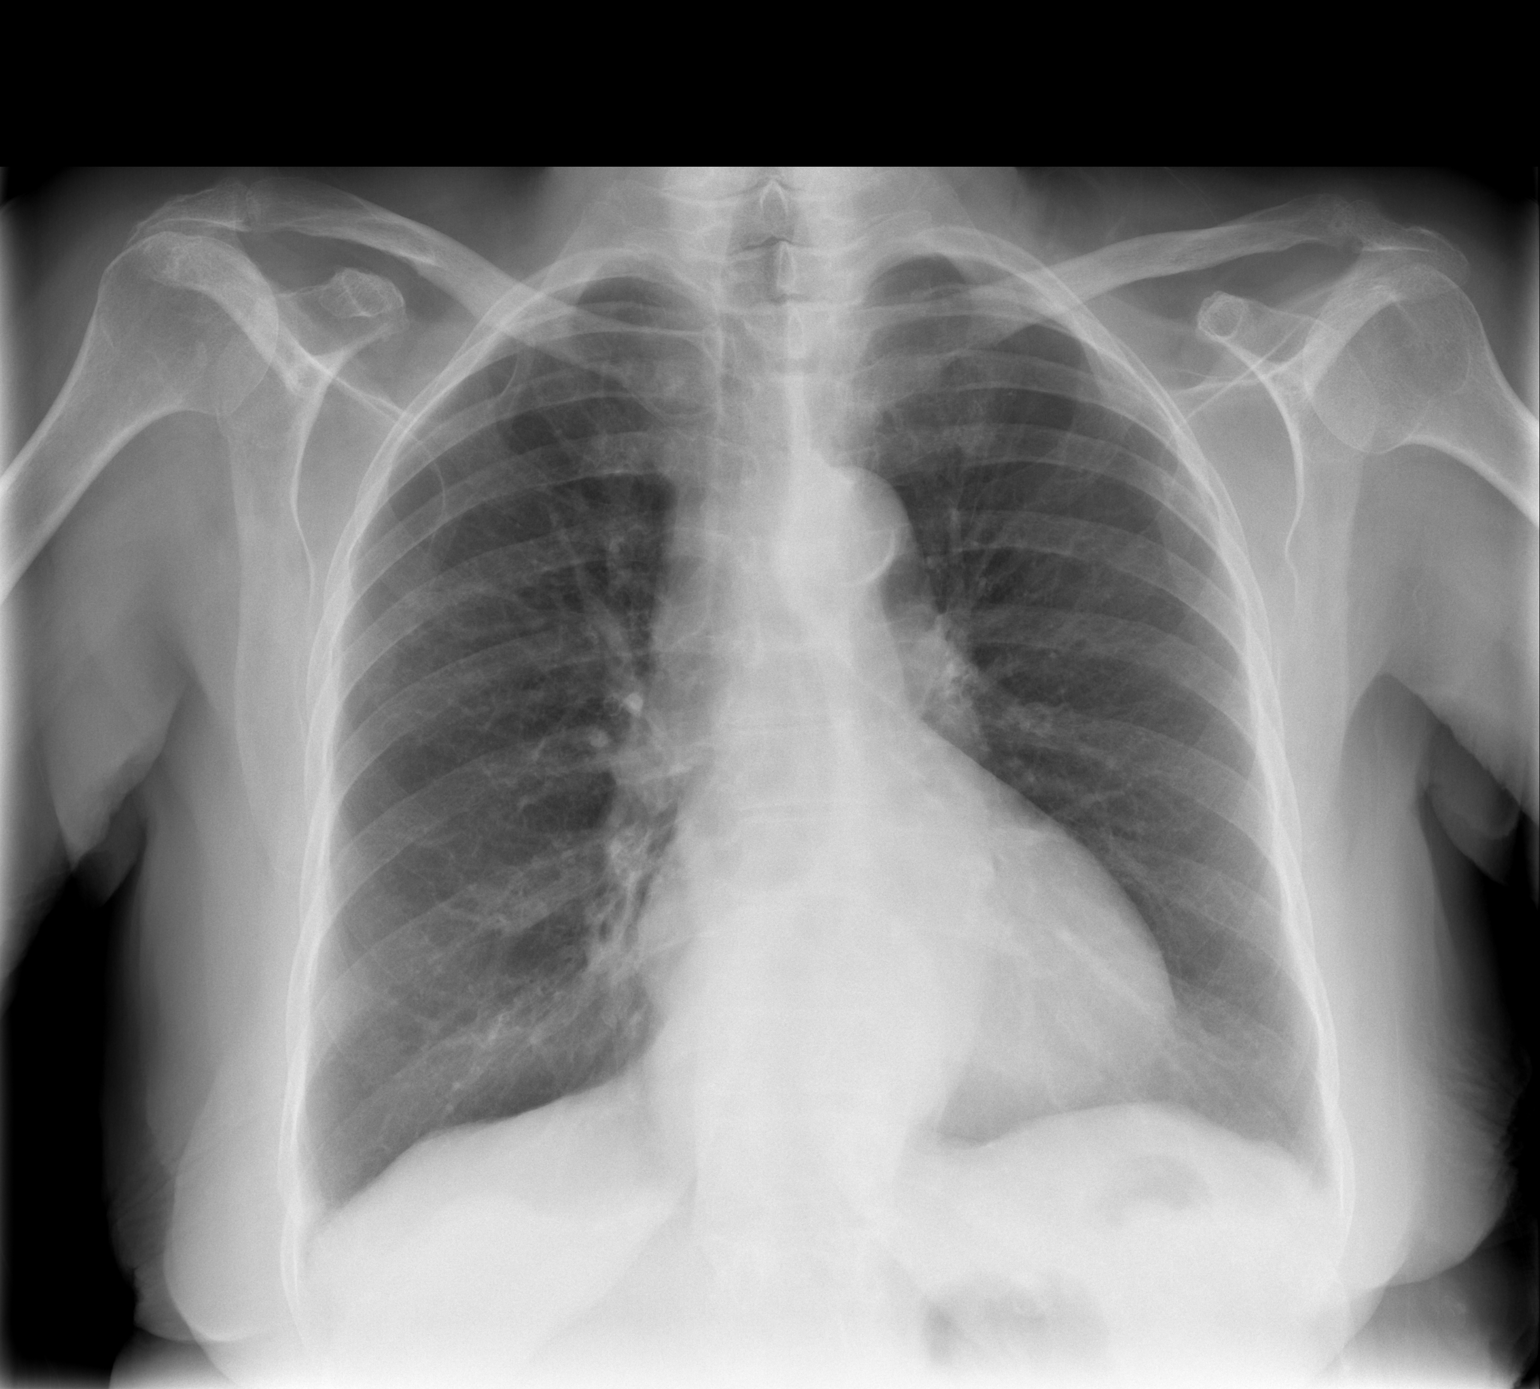

[w chest lat]
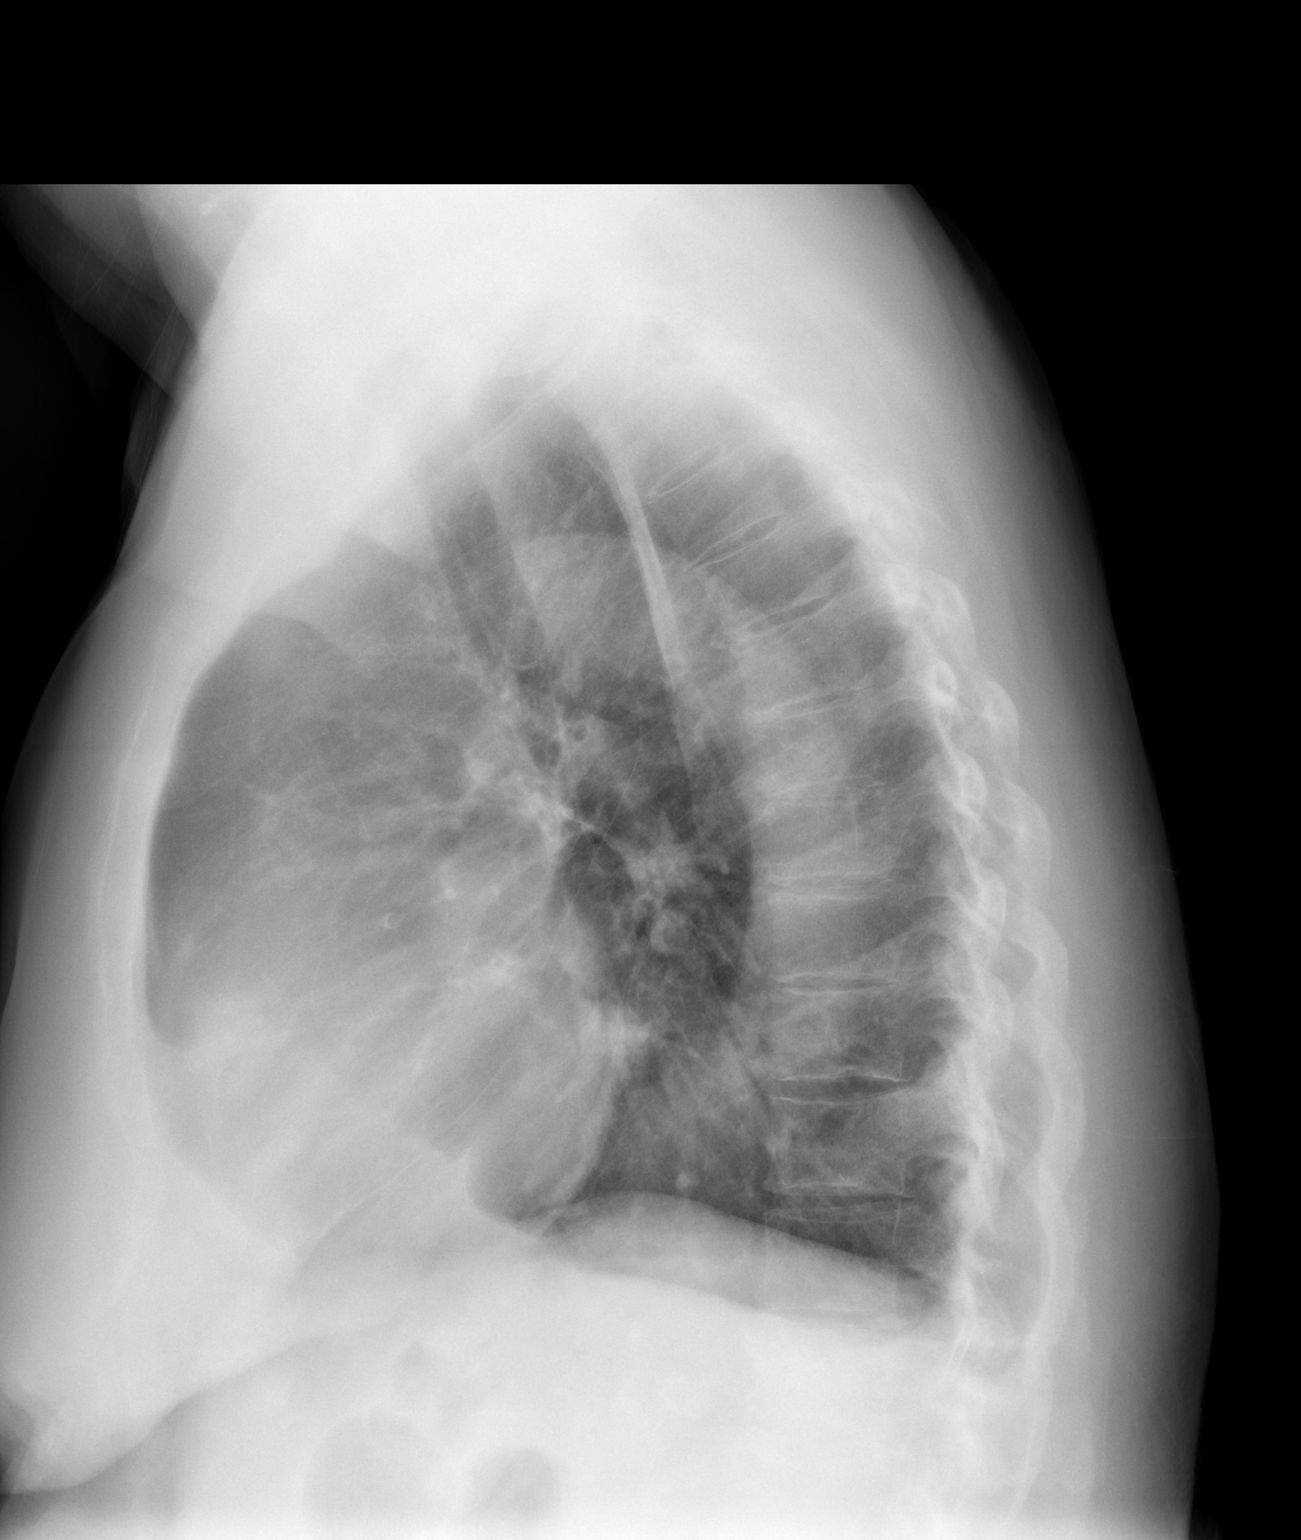

[2 of 2 positions shown; findings below may reference images not displayed]

FINDINGS: Heart size is within normal limits.  Both lungs are
clear.  Moderate size hiatal hernia is again noted.
IMPRESSION: 1.  No active cardiopulmonary disease.
2.  Moderate hiatal hernia.

## 2012-03-30 ENCOUNTER — Emergency Department (HOSPITAL_COMMUNITY): Payer: Medicare Other

## 2012-03-30 ENCOUNTER — Emergency Department (HOSPITAL_COMMUNITY)
Admission: EM | Admit: 2012-03-30 | Discharge: 2012-03-30 | Disposition: A | Discharge status: Home / Self Care | Payer: Medicare Other | Attending: Emergency Medicine | Admitting: Emergency Medicine

## 2012-03-30 ENCOUNTER — Encounter (HOSPITAL_COMMUNITY): Payer: Self-pay | Admitting: *Deleted

## 2012-03-30 DIAGNOSIS — S8000XA Contusion of unspecified knee, initial encounter: Secondary | ICD-10-CM | POA: Insufficient documentation

## 2012-03-30 DIAGNOSIS — W010XXA Fall on same level from slipping, tripping and stumbling without subsequent striking against object, initial encounter: Secondary | ICD-10-CM | POA: Insufficient documentation

## 2012-03-30 DIAGNOSIS — S0083XA Contusion of other part of head, initial encounter: Secondary | ICD-10-CM

## 2012-03-30 DIAGNOSIS — H905 Unspecified sensorineural hearing loss: Secondary | ICD-10-CM | POA: Insufficient documentation

## 2012-03-30 DIAGNOSIS — S022XXA Fracture of nasal bones, initial encounter for closed fracture: Secondary | ICD-10-CM

## 2012-03-30 DIAGNOSIS — Y9389 Activity, other specified: Secondary | ICD-10-CM | POA: Insufficient documentation

## 2012-03-30 DIAGNOSIS — Z8679 Personal history of other diseases of the circulatory system: Secondary | ICD-10-CM | POA: Insufficient documentation

## 2012-03-30 DIAGNOSIS — I10 Essential (primary) hypertension: Secondary | ICD-10-CM | POA: Insufficient documentation

## 2012-03-30 DIAGNOSIS — Z7982 Long term (current) use of aspirin: Secondary | ICD-10-CM | POA: Insufficient documentation

## 2012-03-30 DIAGNOSIS — F068 Other specified mental disorders due to known physiological condition: Secondary | ICD-10-CM | POA: Insufficient documentation

## 2012-03-30 DIAGNOSIS — Z79899 Other long term (current) drug therapy: Secondary | ICD-10-CM | POA: Insufficient documentation

## 2012-03-30 DIAGNOSIS — Y92009 Unspecified place in unspecified non-institutional (private) residence as the place of occurrence of the external cause: Secondary | ICD-10-CM | POA: Insufficient documentation

## 2012-03-30 DIAGNOSIS — S0003XA Contusion of scalp, initial encounter: Secondary | ICD-10-CM | POA: Insufficient documentation

## 2012-03-30 DIAGNOSIS — I509 Heart failure, unspecified: Secondary | ICD-10-CM | POA: Insufficient documentation

## 2012-03-30 DIAGNOSIS — F329 Major depressive disorder, single episode, unspecified: Secondary | ICD-10-CM | POA: Insufficient documentation

## 2012-03-30 DIAGNOSIS — D509 Iron deficiency anemia, unspecified: Secondary | ICD-10-CM | POA: Insufficient documentation

## 2012-03-30 DIAGNOSIS — W19XXXA Unspecified fall, initial encounter: Secondary | ICD-10-CM

## 2012-03-30 DIAGNOSIS — N390 Urinary tract infection, site not specified: Secondary | ICD-10-CM

## 2012-03-30 DIAGNOSIS — S8002XA Contusion of left knee, initial encounter: Secondary | ICD-10-CM

## 2012-03-30 DIAGNOSIS — F3289 Other specified depressive episodes: Secondary | ICD-10-CM | POA: Insufficient documentation

## 2012-03-30 LAB — CBC WITH DIFFERENTIAL/PLATELET
Eosinophils Relative: 2 % (ref 0–5)
HCT: 38.8 % (ref 36.0–46.0)
Hemoglobin: 13 g/dL (ref 12.0–15.0)
Lymphocytes Relative: 12 % (ref 12–46)
MCV: 82.4 fL (ref 78.0–100.0)
Monocytes Absolute: 0.7 10*3/uL (ref 0.1–1.0)
Monocytes Relative: 7 % (ref 3–12)
Neutro Abs: 8.5 10*3/uL — ABNORMAL HIGH (ref 1.7–7.7)
WBC: 10.7 10*3/uL — ABNORMAL HIGH (ref 4.0–10.5)

## 2012-03-30 LAB — COMPREHENSIVE METABOLIC PANEL
BUN: 21 mg/dL (ref 6–23)
CO2: 26 mEq/L (ref 19–32)
Chloride: 98 mEq/L (ref 96–112)
Creatinine, Ser: 0.87 mg/dL (ref 0.50–1.10)
GFR calc Af Amer: 65 mL/min — ABNORMAL LOW (ref >90)
GFR calc non Af Amer: 56 mL/min — ABNORMAL LOW (ref >90)
Glucose, Bld: 154 mg/dL — ABNORMAL HIGH (ref 70–99)
Total Bilirubin: 0.6 mg/dL (ref 0.3–1.2)

## 2012-03-30 LAB — URINALYSIS, ROUTINE W REFLEX MICROSCOPIC
Glucose, UA: NEGATIVE mg/dL
Ketones, ur: NEGATIVE mg/dL
Nitrite: NEGATIVE
Protein, ur: 30 mg/dL — AB
Urobilinogen, UA: 0.2 mg/dL (ref 0.0–1.0)

## 2012-03-30 LAB — APTT: aPTT: 41 seconds — ABNORMAL HIGH (ref 24–37)

## 2012-03-30 LAB — TROPONIN I: Troponin I: <0.3 ng/mL (ref <0.30)

## 2012-03-30 LAB — URINE MICROSCOPIC-ADD ON

## 2012-03-30 MED ORDER — ONDANSETRON HCL 4 MG/2ML IJ SOLN
4.0000 mg | Freq: Once | INTRAMUSCULAR | Status: AC
  Administered 2012-03-30: 4 mg via INTRAVENOUS
  Filled 2012-03-30: qty 2

## 2012-03-30 MED ORDER — NITROFURANTOIN MONOHYD MACRO 100 MG PO CAPS
100.0000 mg | ORAL_CAPSULE | Freq: Once | ORAL | Status: AC
  Administered 2012-03-30: 100 mg via ORAL
  Filled 2012-03-30 (×2): qty 1

## 2012-03-30 MED ORDER — NITROFURANTOIN MONOHYD MACRO 100 MG PO CAPS: 100.0000 mg | ORAL_CAPSULE | Freq: Two times a day (BID) | ORAL | Status: DC

## 2012-03-30 NOTE — ED Notes (Signed)
Pt needing to void every few minutes, very little return, recent h/o recent UTI, h/o chronic UTI. Finished abx ~ 2-3 weeks ago. UA ordered.

## 2012-03-30 NOTE — ED Notes (Signed)
Pt resting with eyes closed, arousable to voice, alert, NAD, calm, interactive, follows commands, to CT/ xray. Son remains in room.
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
## 2012-03-30 NOTE — ED Provider Notes (Signed)
History     CSN: [id number]  Arrival date & time 03/30/12  0309   First MD Initiated Contact with Patient 03/30/12 [id number]      Chief Complaint  Patient presents with  . Fall  . Facial Injury  . Facial Swelling    (Consider location/radiation/quality/duration/timing/severity/associated sxs/prior treatment) HPI [name] is a 79 y.o. female presenting to the emergency department with left-sided facial supraorbital hematoma, patient got up in the dark to go to the restroom, however the power was out she tripped having a mechanical fall and fell face forward. She complains about some mild to moderate, constant, nasal pain but otherwise has no complaints, she denies loss of consciousness, she denies any dizziness, lightheadedness, chest pain or shortness of breath. She had no prodromal symptoms. Patient denies any pain in her extremities or musculoskeletal system. Patient arrives c-collar and on KED.  Patient has history of CHF, hypertension, nonischemic cardiomyopathy, she has no history of blood thinner use   Past Medical History  Diagnosis Date  . CHF (congestive heart failure)     due to probable non-ischemic cardiomyopathy (recovered)  a. ECHO 02/2006: EF 15-20% b. ECHO 12/2006 . EF 60%   . Non-ischemic cardiomyopathy   . HTN (hypertension)     previously severe  . Iron deficiency anemia     a. refuses endoscopy  . Dementia     very mild  . Dizziness   . Depression   . Hearing loss   . H/O: hysterectomy     History reviewed. No pertinent past surgical history.  History reviewed. No pertinent family history.  History  Substance Use Topics  . Smoking status: Never Smoker   . Smokeless tobacco: Not on file  . Alcohol Use: No    OB History   Grav Para Term Preterm Abortions TAB SAB Ect Mult Living                  Review of Systems At least 10pt or greater review of systems completed and are negative except where specified in the HPI.  Allergies  Review of  patient's allergies indicates no known allergies.  Home Medications   Current Outpatient Rx  Name  Route  Sig  Dispense  Refill  . aspirin 81 MG EC tablet   Oral   Take 81 mg by mouth daily.           . carvedilol (COREG) 25 MG tablet   Oral   Take 25 mg by mouth 2 (two) times daily.           Marland Kitchen docusate sodium (COLACE) 100 MG capsule   Oral   Take 100 mg by mouth at bedtime.         . ferrous sulfate 325 (65 FE) MG tablet   Oral   Take 325 mg by mouth daily with breakfast.           . furosemide (LASIX) 40 MG tablet   Oral   Take 40 mg by mouth daily.         Marland Kitchen guaiFENesin (MUCINEX) 600 MG 12 hr tablet   Oral   Take 1,200 mg by mouth 2 (two) times daily as needed for congestion.         Marland Kitchen lisinopril (PRINIVIL,ZESTRIL) 20 MG tablet   Oral   Take 20 mg by mouth 2 (two) times daily.          Marland Kitchen Phenylephrine-DM-GG (QC TUSSIN CF) 5-10-100 MG/5ML LIQD  Oral   Take 10 mLs by mouth every 6 (six) hours as needed. As needed for congestion.         . potassium chloride SA (K-DUR,KLOR-CON) 20 MEQ tablet   Oral   Take 20 mEq by mouth 2 (two) times daily.           . sertraline (ZOLOFT) 100 MG tablet   Oral   Take 50 mg by mouth daily.          . simvastatin (ZOCOR) 20 MG tablet   Oral   Take 20 mg by mouth at bedtime.           . nitrofurantoin, macrocrystal-monohydrate, (MACROBID) 100 MG capsule   Oral   Take 1 capsule (100 mg total) by mouth 2 (two) times daily.   10 capsule   0     BP 202/111  Pulse 64  Temp(Src) 97.9 F (36.6 C) (Oral)  Resp 16  SpO2 97%  Physical Exam  Nursing notes reviewed.  Electronic medical record reviewed. VITAL SIGNS:   Filed Vitals:   03/30/12 0515 03/30/12 0530 03/30/12 0545 03/30/12 0600  BP: 183/102 134/112 201/76 202/111  Pulse: 70 72 67 64  Temp:      TempSrc:      Resp: 22 17 19 16   SpO2: 97% 98% 97% 97%   CONSTITUTIONAL: Awake, oriented, appears non-toxic HENT: Impressive supraorbital  hematoma extending about halfway up the left side of the forehead encompassing the supraorbital ridge, this is contused, mildly discolored purple extends to the nasal bridge., normocephalic, oral mucosa pink and moist, airway patent. Nares patent without drainage. External ears normal. EYES: Conjunctiva clear, EOMI, PERRLA NECK: Trachea midline, non-tender, supple CARDIOVASCULAR: Normal heart rate, Normal rhythm, No murmurs, rubs, gallops PULMONARY/CHEST: Clear to auscultation, no rhonchi, wheezes, or rales. Symmetrical breath sounds. Non-tender. ABDOMINAL: Non-distended, soft, non-tender - no rebound or guarding.  BS normal. NEUROLOGIC: Non-focal, moving all four extremities, no gross sensory or motor deficits. EXTREMITIES: No clubbing, cyanosis, or edema. Mild left knee contusion SKIN: Warm, Dry, No erythema, No rash  ED Course  Procedures (including critical care time)  Date: 03/30/2012  Rate: 65  Rhythm: normal sinus rhythm  QRS Axis: Left axis deviation  Intervals: normal  ST/T Wave abnormalities: normal  Conduction Disutrbances: First degree AV block, PR 224  Narrative Interpretation: unremarkable - no change from prior EKG dated July 27, 202012     Labs Reviewed  APTT - Abnormal; Notable for the following:    aPTT 41 (*)    All other components within normal limits  CBC WITH DIFFERENTIAL - Abnormal; Notable for the following:    WBC 10.7 (*)    Neutrophils Relative 79 (*)    Neutro Abs 8.5 (*)    All other components within normal limits  COMPREHENSIVE METABOLIC PANEL - Abnormal; Notable for the following:    Glucose, Bld 154 (*)    Total Protein 8.5 (*)    GFR calc non Af Amer 56 (*)    GFR calc Af Amer 65 (*)    All other components within normal limits  URINALYSIS, ROUTINE W REFLEX MICROSCOPIC - Abnormal; Notable for the following:    APPearance TURBID (*)    Hgb urine dipstick SMALL (*)    Protein, ur 30 (*)    Leukocytes, UA SMALL (*)    All other components within  normal limits  URINE MICROSCOPIC-ADD ON - Abnormal; Notable for the following:    Squamous Epithelial / LPF MANY (*)  Bacteria, UA MANY (*)    Crystals CA OXALATE CRYSTALS (*)    All other components within normal limits  URINE CULTURE  PROTIME-INR  TROPONIN I   Ct Head Wo Contrast  03/30/2012  *RADIOLOGY REPORT*  Clinical Data:  Status post fall; large frontal scalp hematoma. Concern for head or cervical spine injury.  CT HEAD WITHOUT CONTRAST CT MAXILLOFACIAL WITHOUT CONTRAST CT CERVICAL SPINE WITHOUT CONTRAST  Technique:  Multidetector CT imaging of the head, cervical spine, and maxillofacial structures were performed using the standard protocol without intravenous contrast. Multiplanar CT image reconstructions of the cervical spine and maxillofacial structures were also generated.  Comparison: CT of the head performed 03/07/2006  CT HEAD  Findings: There is no evidence of acute infarction, mass lesion, or intra- or extra-axial hemorrhage on CT.  Diffuse periventricular and subcortical white matter change likely reflects small vessel ischemic microangiopathy.  Prominence of the ventricles and sulci reflects mild cortical volume loss.  The brainstem and fourth ventricle are within normal limits.  The basal ganglia are unremarkable in appearance.  The cerebral hemispheres demonstrate grossly normal gray-white differentiation. No mass effect or midline shift is seen.  There is no evidence of fracture; visualized osseous structures are unremarkable in appearance.  The visualized portions of the orbits are within normal limits.  The paranasal sinuses and mastoid air cells are well-aerated.  A large scalp hematoma is noted overlying the left frontal calvarium and nose.  IMPRESSION:  1.  No evidence of traumatic intracranial injury or fracture. 2.  Diffuse small vessel ischemic microangiopathy and mild cortical volume loss. 3.  Large scalp hematoma overlying the left frontal calvarium and nose.  CT  MAXILLOFACIAL  Findings:  There is question of a tiny fracture involving the right side of the nasal bone.  No additional fractures are seen.  The maxilla and mandible appear intact.  There is complete absence of the dentition.  The orbits are intact bilaterally.  The visualized paranasal sinuses and mastoid air cells are well-aerated.  A prominent scalp hematoma is noted overlying the left frontal calvarium, and soft tissue swelling is noted about the nose and left orbit.  The parapharyngeal fat planes are preserved.  The nasopharynx, oropharynx and hypopharynx are unremarkable in appearance.  The visualized portions of the valleculae and piriform sinuses are grossly unremarkable.  The parotid and submandibular glands are within normal limits.  No cervical lymphadenopathy is seen.  IMPRESSION:  1.  Question of a tiny fracture involving the right side of the nasal bone. 2.  Prominent scalp hematoma noted overlying the left frontal calvarium, and soft tissue swelling about the nose and left orbit.  CT CERVICAL SPINE  Findings:   There is no evidence of fracture or subluxation. Vertebral bodies demonstrate normal height and alignment. Mild multilevel disc space narrowing is noted along the cervical spine, with associated anterior and posterior disc osteophyte complexes. Facet disease is noted on the left side.  Prevertebral soft tissues are within normal limits.  A mildly complex 2.8 cm hypoattenuating mass is noted at the left thyroid lobe, with minimal associated calcification.  The visualized lung apices are clear.  No significant soft tissue abnormalities are seen.  IMPRESSION:  1.  No evidence of fracture or subluxation along the cervical spine. 2.  Mild degenerative change noted along the cervical spine. 3.  Mildly complex 2.8 cm mass at the left thyroid lobe, with minimal associated calcification.  Recommend further evaluation with thyroid ultrasound, if deemed clinically appropriate.  If  patient is clinically  hyperthyroid, consider nuclear medicine thyroid uptake and scan.   Original Report Authenticated By: Tonia Ghent, M.D.    Ct Cervical Spine Wo Contrast  03/30/2012  *RADIOLOGY REPORT*  Clinical Data:  Status post fall; large frontal scalp hematoma. Concern for head or cervical spine injury.  CT HEAD WITHOUT CONTRAST CT MAXILLOFACIAL WITHOUT CONTRAST CT CERVICAL SPINE WITHOUT CONTRAST  Technique:  Multidetector CT imaging of the head, cervical spine, and maxillofacial structures were performed using the standard protocol without intravenous contrast. Multiplanar CT image reconstructions of the cervical spine and maxillofacial structures were also generated.  Comparison: CT of the head performed 03/07/2006  CT HEAD  Findings: There is no evidence of acute infarction, mass lesion, or intra- or extra-axial hemorrhage on CT.  Diffuse periventricular and subcortical white matter change likely reflects small vessel ischemic microangiopathy.  Prominence of the ventricles and sulci reflects mild cortical volume loss.  The brainstem and fourth ventricle are within normal limits.  The basal ganglia are unremarkable in appearance.  The cerebral hemispheres demonstrate grossly normal gray-white differentiation. No mass effect or midline shift is seen.  There is no evidence of fracture; visualized osseous structures are unremarkable in appearance.  The visualized portions of the orbits are within normal limits.  The paranasal sinuses and mastoid air cells are well-aerated.  A large scalp hematoma is noted overlying the left frontal calvarium and nose.  IMPRESSION:  1.  No evidence of traumatic intracranial injury or fracture. 2.  Diffuse small vessel ischemic microangiopathy and mild cortical volume loss. 3.  Large scalp hematoma overlying the left frontal calvarium and nose.  CT MAXILLOFACIAL  Findings:  There is question of a tiny fracture involving the right side of the nasal bone.  No additional fractures are seen.  The  maxilla and mandible appear intact.  There is complete absence of the dentition.  The orbits are intact bilaterally.  The visualized paranasal sinuses and mastoid air cells are well-aerated.  A prominent scalp hematoma is noted overlying the left frontal calvarium, and soft tissue swelling is noted about the nose and left orbit.  The parapharyngeal fat planes are preserved.  The nasopharynx, oropharynx and hypopharynx are unremarkable in appearance.  The visualized portions of the valleculae and piriform sinuses are grossly unremarkable.  The parotid and submandibular glands are within normal limits.  No cervical lymphadenopathy is seen.  IMPRESSION:  1.  Question of a tiny fracture involving the right side of the nasal bone. 2.  Prominent scalp hematoma noted overlying the left frontal calvarium, and soft tissue swelling about the nose and left orbit.  CT CERVICAL SPINE  Findings:   There is no evidence of fracture or subluxation. Vertebral bodies demonstrate normal height and alignment. Mild multilevel disc space narrowing is noted along the cervical spine, with associated anterior and posterior disc osteophyte complexes. Facet disease is noted on the left side.  Prevertebral soft tissues are within normal limits.  A mildly complex 2.8 cm hypoattenuating mass is noted at the left thyroid lobe, with minimal associated calcification.  The visualized lung apices are clear.  No significant soft tissue abnormalities are seen.  IMPRESSION:  1.  No evidence of fracture or subluxation along the cervical spine. 2.  Mild degenerative change noted along the cervical spine. 3.  Mildly complex 2.8 cm mass at the left thyroid lobe, with minimal associated calcification.  Recommend further evaluation with thyroid ultrasound, if deemed clinically appropriate.  If patient is clinically hyperthyroid, consider nuclear  medicine thyroid uptake and scan.   Original Report Authenticated By: Tonia Ghent, M.D.    Dg Knee Complete 4  Views Left  03/30/2012  *RADIOLOGY REPORT*  Clinical Data: Status post fall; left knee anterior bruising.  LEFT KNEE - COMPLETE 4+ VIEW  Comparison: None.  Findings: There is no evidence of fracture or dislocation.  The joint spaces are preserved.  No significant degenerative change is seen; the patellofemoral joint is grossly unremarkable in appearance.  A trace knee joint effusion is seen.  Edema is suggested within Hoffa's fat pad.  Diffuse vascular calcifications are seen.  IMPRESSION:  1.  No evidence of fracture or dislocation. 2.  Trace knee joint effusion is seen.  Edema suggested within Hoffa's fat pad. 3.  Diffuse vascular calcifications are seen.   Original Report Authenticated By: Tonia Ghent, M.D.    Ct Maxillofacial Wo Cm  03/30/2012  *RADIOLOGY REPORT*  Clinical Data:  Status post fall; large frontal scalp hematoma. Concern for head or cervical spine injury.  CT HEAD WITHOUT CONTRAST CT MAXILLOFACIAL WITHOUT CONTRAST CT CERVICAL SPINE WITHOUT CONTRAST  Technique:  Multidetector CT imaging of the head, cervical spine, and maxillofacial structures were performed using the standard protocol without intravenous contrast. Multiplanar CT image reconstructions of the cervical spine and maxillofacial structures were also generated.  Comparison: CT of the head performed 03/07/2006  CT HEAD  Findings: There is no evidence of acute infarction, mass lesion, or intra- or extra-axial hemorrhage on CT.  Diffuse periventricular and subcortical white matter change likely reflects small vessel ischemic microangiopathy.  Prominence of the ventricles and sulci reflects mild cortical volume loss.  The brainstem and fourth ventricle are within normal limits.  The basal ganglia are unremarkable in appearance.  The cerebral hemispheres demonstrate grossly normal gray-white differentiation. No mass effect or midline shift is seen.  There is no evidence of fracture; visualized osseous structures are unremarkable in  appearance.  The visualized portions of the orbits are within normal limits.  The paranasal sinuses and mastoid air cells are well-aerated.  A large scalp hematoma is noted overlying the left frontal calvarium and nose.  IMPRESSION:  1.  No evidence of traumatic intracranial injury or fracture. 2.  Diffuse small vessel ischemic microangiopathy and mild cortical volume loss. 3.  Large scalp hematoma overlying the left frontal calvarium and nose.  CT MAXILLOFACIAL  Findings:  There is question of a tiny fracture involving the right side of the nasal bone.  No additional fractures are seen.  The maxilla and mandible appear intact.  There is complete absence of the dentition.  The orbits are intact bilaterally.  The visualized paranasal sinuses and mastoid air cells are well-aerated.  A prominent scalp hematoma is noted overlying the left frontal calvarium, and soft tissue swelling is noted about the nose and left orbit.  The parapharyngeal fat planes are preserved.  The nasopharynx, oropharynx and hypopharynx are unremarkable in appearance.  The visualized portions of the valleculae and piriform sinuses are grossly unremarkable.  The parotid and submandibular glands are within normal limits.  No cervical lymphadenopathy is seen.  IMPRESSION:  1.  Question of a tiny fracture involving the right side of the nasal bone. 2.  Prominent scalp hematoma noted overlying the left frontal calvarium, and soft tissue swelling about the nose and left orbit.  CT CERVICAL SPINE  Findings:   There is no evidence of fracture or subluxation. Vertebral bodies demonstrate normal height and alignment. Mild multilevel disc space narrowing is noted  along the cervical spine, with associated anterior and posterior disc osteophyte complexes. Facet disease is noted on the left side.  Prevertebral soft tissues are within normal limits.  A mildly complex 2.8 cm hypoattenuating mass is noted at the left thyroid lobe, with minimal associated  calcification.  The visualized lung apices are clear.  No significant soft tissue abnormalities are seen.  IMPRESSION:  1.  No evidence of fracture or subluxation along the cervical spine. 2.  Mild degenerative change noted along the cervical spine. 3.  Mildly complex 2.8 cm mass at the left thyroid lobe, with minimal associated calcification.  Recommend further evaluation with thyroid ultrasound, if deemed clinically appropriate.  If patient is clinically hyperthyroid, consider nuclear medicine thyroid uptake and scan.   Original Report Authenticated By: Tonia Ghent, M.D.      1. Fall at nursing home, initial encounter   2. Facial hematoma, initial encounter   3. Knee contusion, left, initial encounter   4. Nasal bone fracture, closed, initial encounter   5. UTI (lower urinary tract infection)     Medications  ondansetron (ZOFRAN) injection 4 mg (4 mg Intravenous Given 03/30/12 0355)  nitrofurantoin (macrocrystal-monohydrate) (MACROBID) capsule 100 mg (100 mg Oral Given 03/30/12 0645)     MDM  ZULEYKA KLOC is a 79 y.o. female - very spry 79 year old, patient has no complaints besides mild nasal pain. My suspicion is that she has no serious injuries however given the nature of her fall she is at risk for flexion injury, and facial fractures. Also at risk for intracranial hemorrhage. We'll obtain CTs of the head, neck and face to rule out these serious life-threatening injuries. Patient is otherwise neurovascularly intact, she does have some elevated blood pressure. She does have a history of urinary tract infections - also check a UA.  Labs are essentially unremarkable - the exception of a urinalysis which is suggestive of urinary tract infection.  X-ray of the knee is unremarkable, and appears just contused and not fractured, small associated effusion.  Patient's head neck and maxillofacial CAT scan showed a small tiny fracture involving the right side of the nasal bone-also demonstrates  prominent scalp hematoma overlying the left frontal calvarium and soft tissue swelling around the nose and left orbit.  Otherwise CT of the cervical spine unremarkable for acute fracture, no intracranial abnormality.  Patient feels fine, blood pressures have returned back to normal, we'll treat her urinary tract infection and discharge her back to her facility stable and in good condition. I have answered her questions and her son's questions to their satisfaction.  They will followup with primary care in 2 days. Cautioned to return to the ER for any change in mental status, headache or any other concerning symptoms  I explained the diagnosis and have given explicit precautions to return to the ER including symptoms as dictated or any other new or worsening symptoms. The patient understands and accepts the medical plan as it's been dictated and I have answered their questions. Discharge instructions concerning home care and prescriptions have been given.  The patient is STABLE and is discharged to home in good condition.          Jones Skene, MD 04/02/12 1425

## 2012-03-30 NOTE — ED Notes (Signed)
Dr. Rulon Abide, EDP into room, KED removed, c-collar remains. Son at [facility].

## 2012-03-30 NOTE — ED Notes (Signed)
Pt's family states understanding of discharge instructions

## 2012-03-31 LAB — URINE CULTURE

## 2013-04-04 ENCOUNTER — Encounter (HOSPITAL_COMMUNITY): Payer: Self-pay | Admitting: Emergency Medicine

## 2013-04-04 ENCOUNTER — Emergency Department (HOSPITAL_COMMUNITY): Payer: Medicare Other

## 2013-04-04 ENCOUNTER — Inpatient Hospital Stay (HOSPITAL_COMMUNITY)
Admission: EM | Admit: 2013-04-04 | Discharge: 2013-04-06 | DRG: 312 | Disposition: A | Discharge status: Home / Self Care | Payer: Medicare Other | Attending: Internal Medicine | Admitting: Internal Medicine

## 2013-04-04 DIAGNOSIS — I951 Orthostatic hypotension: Principal | ICD-10-CM | POA: Diagnosis present

## 2013-04-04 DIAGNOSIS — Y92009 Unspecified place in unspecified non-institutional (private) residence as the place of occurrence of the external cause: Secondary | ICD-10-CM | POA: Diagnosis not present

## 2013-04-04 DIAGNOSIS — D509 Iron deficiency anemia, unspecified: Secondary | ICD-10-CM | POA: Diagnosis present

## 2013-04-04 DIAGNOSIS — T148XXA Other injury of unspecified body region, initial encounter: Secondary | ICD-10-CM

## 2013-04-04 DIAGNOSIS — F329 Major depressive disorder, single episode, unspecified: Secondary | ICD-10-CM | POA: Diagnosis present

## 2013-04-04 DIAGNOSIS — Z79899 Other long term (current) drug therapy: Secondary | ICD-10-CM

## 2013-04-04 DIAGNOSIS — H9190 Unspecified hearing loss, unspecified ear: Secondary | ICD-10-CM | POA: Diagnosis present

## 2013-04-04 DIAGNOSIS — I10 Essential (primary) hypertension: Secondary | ICD-10-CM

## 2013-04-04 DIAGNOSIS — I509 Heart failure, unspecified: Secondary | ICD-10-CM | POA: Diagnosis present

## 2013-04-04 DIAGNOSIS — G2581 Restless legs syndrome: Secondary | ICD-10-CM | POA: Diagnosis present

## 2013-04-04 DIAGNOSIS — G309 Alzheimer's disease, unspecified: Secondary | ICD-10-CM | POA: Diagnosis present

## 2013-04-04 DIAGNOSIS — S0003XA Contusion of scalp, initial encounter: Secondary | ICD-10-CM | POA: Diagnosis present

## 2013-04-04 DIAGNOSIS — Z7982 Long term (current) use of aspirin: Secondary | ICD-10-CM

## 2013-04-04 DIAGNOSIS — W19XXXA Unspecified fall, initial encounter: Secondary | ICD-10-CM | POA: Diagnosis present

## 2013-04-04 DIAGNOSIS — F0280 Dementia in other diseases classified elsewhere without behavioral disturbance: Secondary | ICD-10-CM | POA: Diagnosis present

## 2013-04-04 DIAGNOSIS — E782 Mixed hyperlipidemia: Secondary | ICD-10-CM | POA: Diagnosis present

## 2013-04-04 DIAGNOSIS — I428 Other cardiomyopathies: Secondary | ICD-10-CM | POA: Diagnosis present

## 2013-04-04 DIAGNOSIS — E785 Hyperlipidemia, unspecified: Secondary | ICD-10-CM | POA: Diagnosis present

## 2013-04-04 DIAGNOSIS — I959 Hypotension, unspecified: Secondary | ICD-10-CM

## 2013-04-04 DIAGNOSIS — S8000XA Contusion of unspecified knee, initial encounter: Secondary | ICD-10-CM | POA: Diagnosis present

## 2013-04-04 DIAGNOSIS — F3289 Other specified depressive episodes: Secondary | ICD-10-CM | POA: Diagnosis present

## 2013-04-04 DIAGNOSIS — R55 Syncope and collapse: Secondary | ICD-10-CM | POA: Diagnosis present

## 2013-04-04 DIAGNOSIS — I5032 Chronic diastolic (congestive) heart failure: Secondary | ICD-10-CM | POA: Diagnosis present

## 2013-04-04 DIAGNOSIS — S0083XA Contusion of other part of head, initial encounter: Secondary | ICD-10-CM

## 2013-04-04 DIAGNOSIS — S1093XA Contusion of unspecified part of neck, initial encounter: Secondary | ICD-10-CM

## 2013-04-04 DIAGNOSIS — R42 Dizziness and giddiness: Secondary | ICD-10-CM | POA: Diagnosis present

## 2013-04-04 DIAGNOSIS — I501 Left ventricular failure: Secondary | ICD-10-CM

## 2013-04-04 HISTORY — DX: Alzheimer's disease, unspecified: G30.9

## 2013-04-04 HISTORY — DX: Dementia in other diseases classified elsewhere, unspecified severity, without behavioral disturbance, psychotic disturbance, mood disturbance, and anxiety: F02.80

## 2013-04-04 LAB — CBC WITH DIFFERENTIAL/PLATELET
BASOS ABS: 0 10*3/uL (ref 0.0–0.1)
Basophils Relative: 0 % (ref 0–1)
EOS PCT: 3 % (ref 0–5)
Eosinophils Absolute: 0.2 10*3/uL (ref 0.0–0.7)
HEMATOCRIT: 33.6 % — AB (ref 36.0–46.0)
Hemoglobin: 11 g/dL — ABNORMAL LOW (ref 12.0–15.0)
LYMPHS ABS: 1.1 10*3/uL (ref 0.7–4.0)
LYMPHS PCT: 14 % (ref 12–46)
MCH: 26.5 pg (ref 26.0–34.0)
MCHC: 32.7 g/dL (ref 30.0–36.0)
MCV: 81 fL (ref 78.0–100.0)
Monocytes Absolute: 1 10*3/uL (ref 0.1–1.0)
Monocytes Relative: 13 % — ABNORMAL HIGH (ref 3–12)
NEUTROS ABS: 5.5 10*3/uL (ref 1.7–7.7)
Neutrophils Relative %: 70 % (ref 43–77)
PLATELETS: 308 10*3/uL (ref 150–400)
RBC: 4.15 MIL/uL (ref 3.87–5.11)
RDW: 15.3 % (ref 11.5–15.5)
WBC: 7.8 10*3/uL (ref 4.0–10.5)

## 2013-04-04 LAB — URINALYSIS, ROUTINE W REFLEX MICROSCOPIC
Bilirubin Urine: NEGATIVE
GLUCOSE, UA: NEGATIVE mg/dL
Hgb urine dipstick: NEGATIVE
Ketones, ur: NEGATIVE mg/dL
LEUKOCYTES UA: NEGATIVE
Nitrite: NEGATIVE
PH: 7.5 (ref 5.0–8.0)
Protein, ur: 30 mg/dL — AB
Specific Gravity, Urine: 1.014 (ref 1.005–1.030)
Urobilinogen, UA: 0.2 mg/dL (ref 0.0–1.0)

## 2013-04-04 LAB — URINE MICROSCOPIC-ADD ON

## 2013-04-04 LAB — BASIC METABOLIC PANEL
BUN: 22 mg/dL (ref 6–23)
CHLORIDE: 95 meq/L — AB (ref 96–112)
CO2: 25 meq/L (ref 19–32)
Calcium: 9.1 mg/dL (ref 8.4–10.5)
Creatinine, Ser: 1.04 mg/dL (ref 0.50–1.10)
GFR calc Af Amer: 52 mL/min — ABNORMAL LOW (ref >90)
GFR calc non Af Amer: 45 mL/min — ABNORMAL LOW (ref >90)
GLUCOSE: 126 mg/dL — AB (ref 70–99)
POTASSIUM: 4.4 meq/L (ref 3.7–5.3)
SODIUM: 134 meq/L — AB (ref 137–147)

## 2013-04-04 LAB — CBG MONITORING, ED: GLUCOSE-CAPILLARY: 123 mg/dL — AB (ref 70–99)

## 2013-04-04 LAB — POC OCCULT BLOOD, ED: Fecal Occult Bld: NEGATIVE

## 2013-04-04 MED ORDER — FUROSEMIDE 40 MG PO TABS
40.0000 mg | ORAL_TABLET | Freq: Every day | ORAL | Status: DC
  Administered 2013-04-05: 40 mg via ORAL
  Filled 2013-04-04: qty 1

## 2013-04-04 MED ORDER — LOPERAMIDE HCL 2 MG PO CAPS: 2.0000 mg | ORAL_CAPSULE | ORAL | Status: DC | PRN

## 2013-04-04 MED ORDER — HYDRALAZINE HCL 20 MG/ML IJ SOLN
10.0000 mg | Freq: Four times a day (QID) | INTRAMUSCULAR | Status: DC | PRN
  Administered 2013-04-04: 10 mg via INTRAVENOUS
  Filled 2013-04-04: qty 1

## 2013-04-04 MED ORDER — SODIUM CHLORIDE 0.9 % IJ SOLN
3.0000 mL | Freq: Two times a day (BID) | INTRAMUSCULAR | Status: DC
  Administered 2013-04-05 (×2): 3 mL via INTRAVENOUS

## 2013-04-04 MED ORDER — TETANUS-DIPHTH-ACELL PERTUSSIS 5-2.5-18.5 LF-MCG/0.5 IM SUSP
0.5000 mL | Freq: Once | INTRAMUSCULAR | Status: AC
  Administered 2013-04-04: 0.5 mL via INTRAMUSCULAR
  Filled 2013-04-04: qty 0.5

## 2013-04-04 MED ORDER — ONDANSETRON HCL 4 MG/2ML IJ SOLN: 4.0000 mg | Freq: Four times a day (QID) | INTRAMUSCULAR | Status: DC | PRN

## 2013-04-04 MED ORDER — ONDANSETRON HCL 4 MG PO TABS: 4.0000 mg | ORAL_TABLET | Freq: Four times a day (QID) | ORAL | Status: DC | PRN

## 2013-04-04 MED ORDER — GUAIFENESIN-DM 100-10 MG/5ML PO SYRP
10.0000 mL | ORAL_SOLUTION | ORAL | Status: DC | PRN
  Filled 2013-04-04: qty 10

## 2013-04-04 MED ORDER — SULFAMETHOXAZOLE-TMP DS 800-160 MG PO TABS
1.0000 | ORAL_TABLET | Freq: Every day | ORAL | Status: DC
  Administered 2013-04-05 – 2013-04-06 (×2): 1 via ORAL
  Filled 2013-04-04 (×2): qty 1

## 2013-04-04 MED ORDER — ACETAMINOPHEN 325 MG PO TABS
650.0000 mg | ORAL_TABLET | Freq: Once | ORAL | Status: AC
  Administered 2013-04-04: 650 mg via ORAL
  Filled 2013-04-04: qty 2

## 2013-04-04 MED ORDER — LISINOPRIL 20 MG PO TABS
20.0000 mg | ORAL_TABLET | Freq: Every day | ORAL | Status: DC
  Administered 2013-04-05: 20 mg via ORAL
  Filled 2013-04-04 (×2): qty 1

## 2013-04-04 MED ORDER — ENOXAPARIN SODIUM 40 MG/0.4ML ~~LOC~~ SOLN
40.0000 mg | SUBCUTANEOUS | Status: DC
  Administered 2013-04-05 – 2013-04-06 (×2): 40 mg via SUBCUTANEOUS
  Filled 2013-04-04 (×2): qty 0.4

## 2013-04-04 MED ORDER — SERTRALINE HCL 100 MG PO TABS
100.0000 mg | ORAL_TABLET | Freq: Every morning | ORAL | Status: DC
  Administered 2013-04-05 – 2013-04-06 (×2): 100 mg via ORAL
  Filled 2013-04-04 (×2): qty 1

## 2013-04-04 MED ORDER — ASPIRIN 81 MG PO CHEW
81.0000 mg | CHEWABLE_TABLET | Freq: Every day | ORAL | Status: DC
  Administered 2013-04-05 – 2013-04-06 (×2): 81 mg via ORAL
  Filled 2013-04-04 (×4): qty 1

## 2013-04-04 MED ORDER — CARVEDILOL 25 MG PO TABS
25.0000 mg | ORAL_TABLET | Freq: Two times a day (BID) | ORAL | Status: DC
  Administered 2013-04-05 (×2): 25 mg via ORAL
  Filled 2013-04-04 (×3): qty 1

## 2013-04-04 NOTE — ED Notes (Signed)
Pt is reporting nausea, which is relieved when she sits up.

## 2013-04-04 NOTE — H&P (Signed)
[facility] History and Physical  [name] ONG:[id number] DOB: [birth year] DOA: 04/04/2013  Referring physician: ED physician PCP: Pt does note know name of PCP as she not seen anyone in the past several years   Chief Complaint: fall  HPI:  Pt is 80 yo female with Alzheimer's dementia, HTN, diastolic CHF (grade I) per last 2 D ECHO in 2011, presenting to [facility] ED after sustaining an episode of fall at home. She explains she was trying to get some stuff from her closet and suddenly felt dizzy and fell to the floor. She denies LOC but explains she hit her head and has hit left arm, left leg. She denies chest pain, shortness of breat, no specific abdominal or urinary concerns, no specific focal neurological symptoms, no fevers, chills, no similar events in the recent past. At baseline uses walker to assist with ambulation.   In ED, pt noted to be hemodynamically stable but initial BP noted to be 206/104 mmHg. Pt also noted to have hematoma on the left side of the head with skin tear to left arm and hematoma to left knee.   Assessment and Plan: Active Problems: Fall - possibly mechanical but clear etiology is not known - will admit for observation to telemetry unit - will ask for 12 lead EKG, check TSH, UA,  - check orthostatic vitals  - PT/OT evaluation - please note that pt is on chronic Bactrim for chronic UTI and will continue home medical regimen - follow upon urine culture  HTN, accelerated - continue home medical regimen Coreg, Lasix, Lisinopril  Chronic diastolic CHF, grade I - clinically compensated - continue Lasix as per home medical regimen - weight on admitting 164 lbs - will stop IVF for now, check orthostatic vitals - monitor daily weights, strict I's and O's - check BMP in AM to ensure renal function is stable  Alzheimer's dementia - appears to be stable and at pt's baseline   Radiological Exams on Admission: Dg Elbow Complete Left  04/04/2013  Mild  degenerative changes but no acute fracture or joint effusion.   Ct Head Wo Contrast  04/04/2013  Large left frontal scalp hematoma. No skull fracture or intracranial hemorrhage. Progressive atrophy and chronic small vessel ischemic changes.    Code Status: Full Family Communication: Pt at bedside, no other family member at the bedside  Disposition Plan: Admit for further evaluation    Review of Systems:  Constitutional: Negative for fever, chills. Negative for diaphoresis.  HENT: Negative for hearing loss, ear pain, nosebleeds, congestion, sore throat, neck pain, tinnitus and ear discharge.   Eyes: Negative for blurred vision, double vision, photophobia, pain, discharge and redness.  Respiratory: Negative for cough, hemoptysis, sputum production, shortness of breath, wheezing and stridor.   Cardiovascular: Negative for chest pain, palpitations, orthopnea, claudication and leg swelling.  Gastrointestinal: Negative for nausea, vomiting and abdominal pain. Negative for heartburn, constipation, blood in stool and melena.  Genitourinary: Negative for dysuria, urgency, frequency, hematuria and flank pain.  Musculoskeletal: Negative for myalgias, back pain.  Skin: Negative for itching and rash.  Neurological: Negative for tingling, tremors, sensory change, speech change.  Endo/Heme/Allergies: Negative for environmental allergies and polydipsia. Does not bruise/bleed easily.  Psychiatric/Behavioral: Negative for suicidal ideas. The patient is not nervous/anxious.      Past Medical History  Diagnosis Date  . CHF (congestive heart failure)     due to probable non-ischemic cardiomyopathy (recovered)  a. ECHO 02/2006: EF 15-20% b. ECHO 12/2006 . EF 60%   .  Non-ischemic cardiomyopathy   . HTN (hypertension)     previously severe  . Iron deficiency anemia     a. refuses endoscopy  . Dementia     very mild  . Dizziness   . Depression   . Hearing loss   . H/O: hysterectomy   . Dementia   .  Alzheimer disease     History reviewed. No pertinent past surgical history.  Social History:  reports that she has never smoked. She does not have any smokeless tobacco history on file. She reports that she does not drink alcohol or use illicit drugs.  No Known Allergies  No known family medical history   Medication Sig  aspirin 81 MG EC tablet Take 81 mg by mouth daily.    carvedilol (COREG) 25 MG tablet Take 25 mg by mouth 2 (two) times daily.    furosemide (LASIX) 40 MG tablet Take 40 mg by mouth daily.  guaiFENesin-dextromethorphan (ROBITUSSIN DM) 100-10 MG/5ML syrup Take 10 mLs by mouth every 4 (four) hours as needed for cough.  lisinopril (PRINIVIL,ZESTRIL) 20 MG tablet Take 20 mg by mouth daily.   sertraline (ZOLOFT) 100 MG tablet Take 100 mg by mouth every morning.  sulfamethoxazole-trimethoprim (BACTRIM DS) 800-160 MG per tablet Take 1 tablet by mouth daily. *for recurrent UTI*    Physical Exam: Filed Vitals:   04/04/13 1715 04/04/13 1716 04/04/13 1800 04/04/13 1915  BP: 193/85 165/125 197/85 179/77  Pulse: 72 74 68 75  Temp:  98.1 F (36.7 C)    TempSrc:  Oral    Resp: 14 15 21 20   SpO2: 99% 100% 99% 98%    Physical Exam  Constitutional: Appears well-developed and well-nourished. No distress.  HENT: Normocephalic. External right and left ear normal. Dry MM Eyes: Conjunctivae and EOM are normal. PERRLA, no scleral icterus.  Neck: Normal ROM. Neck supple. No JVD. No tracheal deviation. No thyromegaly.  CVS: RRR, S1/S2 +, no murmurs, no gallops, no carotid bruit.  Pulmonary: Effort and breath sounds normal, no stridor, rhonchi, wheezes, rales.  Abdominal: Soft. BS +,  no distension, tenderness, rebound or guarding.  Musculoskeletal: Normal range of motion. No edema and no tenderness.  Lymphadenopathy: No lymphadenopathy noted, cervical, inguinal. Neuro: Alert. Normal reflexes, muscle tone coordination. No cranial nerve deficit. Skin: Skin is warm and dry. No rash  noted. Not diaphoretic. No erythema. No pallor.  Psychiatric: Normal mood and affect. Behavior, judgment, thought content normal.   Labs on Admission:  Basic Metabolic Panel:  Recent Labs Lab 04/04/13 1735  NA 134*  K 4.4  CL 95*  CO2 25  GLUCOSE 126*  BUN 22  CREATININE 1.04  CALCIUM 9.1   CBC:  Recent Labs Lab 04/04/13 1735  WBC 7.8  NEUTROABS 5.5  HGB 11.0*  HCT 33.6*  MCV 81.0  PLT 308   CBG:  Recent Labs Lab 04/04/13 1725  GLUCAP 123*   EKG: Normal sinus rhythm, no ST/T wave changes  Debbora Presto, MD  [facility] Pager [phone]  If 7PM-7AM, please contact night-coverage [email or web address] Password [facility] 04/04/2013, 8:50 PM

## 2013-04-04 NOTE — ED Notes (Signed)
Pt was looking in her closet and felt dizzy then fell to the floor. Pt denies losing consciousness. Pt hit her head and has hematoma to left head, skin tear to left arm, and hematoma to left knee. Pt usually walks with walker. No neuro deficits. Pt A/0. BP 206/104 for EMS, HR 70. Pt is from [facility]

## 2013-04-04 NOTE — ED Provider Notes (Signed)
CSN: [id number]     Arrival date & time 04/04/13  1706 History   First MD Initiated Contact with Patient 04/04/13 1720     Chief Complaint  Patient presents with  . Fall     (Consider location/radiation/quality/duration/timing/severity/associated sxs/prior Treatment) HPI Comments: 80 yo female with a fall while at home. States she got dizzy and tripped as she was stumbling. Hit her head but did not lose consciousness. Has a frontal headache. Also got a skin tear to left arm. Denies palpitations or chest pain. Has felt recurrently dizzy since. No weakness. No shortness of breath. Is on aspirin but no other blood thinners.    Past Medical History  Diagnosis Date  . CHF (congestive heart failure)     due to probable non-ischemic cardiomyopathy (recovered)  a. ECHO 02/2006: EF 15-20% b. ECHO 12/2006 . EF 60%   . Non-ischemic cardiomyopathy   . HTN (hypertension)     previously severe  . Iron deficiency anemia     a. refuses endoscopy  . Dementia     very mild  . Dizziness   . Depression   . Hearing loss   . H/O: hysterectomy   . Dementia   . Alzheimer disease    History reviewed. No pertinent past surgical history. History reviewed. No pertinent family history. History  Substance Use Topics  . Smoking status: Never Smoker   . Smokeless tobacco: Not on file  . Alcohol Use: No   OB History   Grav Para Term Preterm Abortions TAB SAB Ect Mult Living                 Review of Systems  Unable to perform ROS: Dementia      Allergies  Review of patient's allergies indicates no known allergies.  Home Medications   Current Outpatient Rx  Name  Route  Sig  Dispense  Refill  . aspirin 81 MG EC tablet   Oral   Take 81 mg by mouth daily.           . carvedilol (COREG) 25 MG tablet   Oral   Take 25 mg by mouth 2 (two) times daily.           . furosemide (LASIX) 40 MG tablet   Oral   Take 40 mg by mouth daily.         Marland Kitchen guaiFENesin-dextromethorphan (ROBITUSSIN  DM) 100-10 MG/5ML syrup   Oral   Take 10 mLs by mouth every 4 (four) hours as needed for cough.         Marland Kitchen lisinopril (PRINIVIL,ZESTRIL) 20 MG tablet   Oral   Take 20 mg by mouth daily.          Marland Kitchen loperamide (IMODIUM) 2 MG capsule   Oral   Take 2 mg by mouth as needed for diarrhea or loose stools (*max of 16mg  per 34 hours*).         Marland Kitchen sertraline (ZOLOFT) 100 MG tablet   Oral   Take 100 mg by mouth every morning.         . sulfamethoxazole-trimethoprim (BACTRIM DS) 800-160 MG per tablet   Oral   Take 1 tablet by mouth daily. *for recurrent UTI*          BP 165/125  Pulse 74  Temp(Src) 98.1 F (36.7 C) (Oral)  Resp 15  SpO2 100% Physical Exam  Nursing note and vitals reviewed. Constitutional: She appears well-developed and well-nourished.  HENT:  Head: Normocephalic.  Right Ear: External ear normal.  Left Ear: External ear normal.  Nose: Nose normal.  Eyes: EOM are normal. Pupils are equal, round, and reactive to light. Right eye exhibits no discharge. Left eye exhibits no discharge.  Cardiovascular: Normal rate, regular rhythm and normal heart sounds.   Pulmonary/Chest: Effort normal and breath sounds normal.  Abdominal: Soft. There is no tenderness.  Musculoskeletal:       Left forearm: She exhibits tenderness.       Arms: Neurological: She is alert. She is disoriented.  Equal strength in all 4 extremities.   Skin: Skin is warm and dry.    ED Course  Procedures (including critical care time) Labs Review Labs Reviewed  CBC WITH DIFFERENTIAL - Abnormal; Notable for the following:    Hemoglobin 11.0 (*)    HCT 33.6 (*)    Monocytes Relative 13 (*)    All other components within normal limits  BASIC METABOLIC PANEL - Abnormal; Notable for the following:    Sodium 134 (*)    Chloride 95 (*)    Glucose, Bld 126 (*)    GFR calc non Af Amer 45 (*)    GFR calc Af Amer 52 (*)    All other components within normal limits  URINALYSIS, ROUTINE W REFLEX  MICROSCOPIC - Abnormal; Notable for the following:    APPearance CLOUDY (*)    Protein, ur 30 (*)    All other components within normal limits  CBG MONITORING, ED - Abnormal; Notable for the following:    Glucose-Capillary 123 (*)    All other components within normal limits  URINE CULTURE  MRSA PCR SCREENING  URINE MICROSCOPIC-ADD ON  BASIC METABOLIC PANEL  CBC  TSH  URINALYSIS, ROUTINE W REFLEX MICROSCOPIC  PRO B NATRIURETIC PEPTIDE  POC OCCULT BLOOD, ED   Imaging Review Dg Elbow Complete Left  04/04/2013   CLINICAL DATA:  Larey Seat.  Elbow pain.  EXAM: LEFT ELBOW - COMPLETE 3+ VIEW  COMPARISON:  None.  FINDINGS: The joint spaces are maintained. Mild degenerative changes. No acute fracture or osteochondral lesion. No joint effusion.  IMPRESSION: Mild degenerative changes but no acute fracture or joint effusion.   Electronically Signed   By: Loralie Champagne M.D.   On: 04/04/2013 18:49   Ct Head Wo Contrast  04/04/2013   CLINICAL DATA:  Left forehead hematoma following a fall and hitting her head today.  EXAM: CT HEAD WITHOUT CONTRAST  TECHNIQUE: Contiguous axial images were obtained from the base of the skull through the vertex without intravenous contrast.  COMPARISON:  03/30/2012.  FINDINGS: Large left frontal scalp hematoma. No skull fracture, intracranial hemorrhage or paranasal sinus air-fluid levels. Diffusely enlarged ventricles and subarachnoid spaces. Patchy white matter low density in both cerebral hemispheres. Patchy low density in the midbrain, pons and proximal medulla. Bilateral ethmoid sinus mucosal thickening.  IMPRESSION: 1. Large left frontal scalp hematoma. 2. No skull fracture or intracranial hemorrhage. 3. Progressive atrophy and chronic small vessel ischemic changes.   Electronically Signed   By: Gordan Payment M.D.   On: 04/04/2013 18:20     EKG Interpretation None      Date: 04/04/2013  Rate: 71  Rhythm: normal sinus rhythm  QRS Axis: left  Intervals: normal  ST/T  Wave abnormalities: normal  Conduction Disutrbances:left bundle branch block  Narrative Interpretation: LBBB appears new  Old EKG Reviewed: changes noted    MDM   Final diagnoses:  Near syncope  Fall  Traumatic hematoma of forehead  Skin abrasion    Patient well appearing here besides injuries as above. Given her intermittent dizziness and hx of CHF, will need admission for near-syncope/dizziness. Tetanus updated. No signs of stroke. Admit to medicine for observation.    Audree CamelScott T Braxtyn Dorff, MD 04/05/13 [phone]

## 2013-04-05 ENCOUNTER — Other Ambulatory Visit: Payer: Self-pay

## 2013-04-05 DIAGNOSIS — R55 Syncope and collapse: Secondary | ICD-10-CM | POA: Diagnosis present

## 2013-04-05 DIAGNOSIS — W19XXXA Unspecified fall, initial encounter: Secondary | ICD-10-CM

## 2013-04-05 DIAGNOSIS — I951 Orthostatic hypotension: Principal | ICD-10-CM | POA: Diagnosis present

## 2013-04-05 LAB — CBC
HCT: 32.7 % — ABNORMAL LOW (ref 36.0–46.0)
HEMOGLOBIN: 10.6 g/dL — AB (ref 12.0–15.0)
MCH: 26 pg (ref 26.0–34.0)
MCHC: 32.4 g/dL (ref 30.0–36.0)
MCV: 80.3 fL (ref 78.0–100.0)
PLATELETS: 296 10*3/uL (ref 150–400)
RBC: 4.07 MIL/uL (ref 3.87–5.11)
RDW: 15.3 % (ref 11.5–15.5)
WBC: 8.6 10*3/uL (ref 4.0–10.5)

## 2013-04-05 LAB — TSH: TSH: 2.667 u[IU]/mL (ref 0.350–4.500)

## 2013-04-05 LAB — BASIC METABOLIC PANEL
BUN: 17 mg/dL (ref 6–23)
CALCIUM: 9.4 mg/dL (ref 8.4–10.5)
CO2: 22 mEq/L (ref 19–32)
Chloride: 99 mEq/L (ref 96–112)
Creatinine, Ser: 0.94 mg/dL (ref 0.50–1.10)
GFR calc Af Amer: 58 mL/min — ABNORMAL LOW (ref >90)
GFR, EST NON AFRICAN AMERICAN: 50 mL/min — AB (ref >90)
Glucose, Bld: 98 mg/dL (ref 70–99)
Potassium: 3.9 mEq/L (ref 3.7–5.3)
Sodium: 135 mEq/L — ABNORMAL LOW (ref 137–147)

## 2013-04-05 LAB — TROPONIN I

## 2013-04-05 LAB — MRSA PCR SCREENING: MRSA by PCR: NEGATIVE

## 2013-04-05 LAB — PRO B NATRIURETIC PEPTIDE: Pro B Natriuretic peptide (BNP): 1302 pg/mL — ABNORMAL HIGH (ref 0–450)

## 2013-04-05 MED ORDER — CARVEDILOL 12.5 MG PO TABS
12.5000 mg | ORAL_TABLET | Freq: Two times a day (BID) | ORAL | Status: DC
  Administered 2013-04-06: 12.5 mg via ORAL
  Filled 2013-04-05 (×3): qty 1

## 2013-04-05 MED ORDER — ENSURE COMPLETE PO LIQD
237.0000 mL | Freq: Two times a day (BID) | ORAL | Status: DC
  Administered 2013-04-06: 237 mL via ORAL

## 2013-04-05 MED ORDER — ROPINIROLE HCL 0.25 MG PO TABS
0.2500 mg | ORAL_TABLET | Freq: Every day | ORAL | Status: DC
  Administered 2013-04-05: 0.25 mg via ORAL
  Filled 2013-04-05 (×2): qty 1

## 2013-04-05 MED ORDER — LORAZEPAM 0.5 MG PO TABS: 0.5000 mg | ORAL_TABLET | Freq: Two times a day (BID) | ORAL | Status: DC | PRN

## 2013-04-05 NOTE — Evaluation (Signed)
Physical Therapy Evaluation Patient Details Name: [name] MRN: [id number] DOB: Jun 14, 1916 Today's Date: 04/05/2013 Time: [id number] PT Time Calculation (min): 18 min  PT Assessment / Plan / Recommendation History of Present Illness  pt presents with falls, Dizziness, and hx of Dementia.    Clinical Impression  Pt moving near baseline per son.  Feel pt appropriate to return to ALF and would benefit from HHPT for balance.  Will continue to follow.      PT Assessment  Patient needs continued PT services    Follow Up Recommendations  Home health PT (at ALF)    Does the patient have the potential to tolerate intense rehabilitation      Barriers to Discharge        Equipment Recommendations  None recommended by PT    Recommendations for Other Services     Frequency Min 3X/week    Precautions / Restrictions Precautions Precautions: Fall Restrictions Weight Bearing Restrictions: No   Pertinent Vitals/Pain Denied pain.        Mobility  Bed Mobility Overal bed mobility: Modified Independent General bed mobility comments: pt moves slowly and uses bed rails.   Transfers Overall transfer level: Needs assistance Equipment used: Rolling walker (2 wheeled) Transfers: Sit to/from Stand Sit to Stand: Min guard General transfer comment: pt demos good use of UEs and gets close prior to sitting.   Ambulation/Gait Ambulation/Gait assistance: Min guard Ambulation Distance (Feet): 100 Feet Assistive device: Rolling walker (2 wheeled) Gait Pattern/deviations: Step-through pattern;Decreased stride length;Trunk flexed Gait velocity interpretation: Below normal speed for age/gender General Gait Details: pt needs cues to stay closer to RW.  Per son pt looks to be near baselinef    Exercises     PT Diagnosis: Difficulty walking  PT Problem List: Decreased strength;Decreased activity tolerance;Decreased balance;Decreased mobility;Decreased coordination;Decreased cognition;Decreased  knowledge of use of DME PT Treatment Interventions: DME instruction;Gait training;Functional mobility training;Therapeutic activities;Therapeutic exercise;Balance training;Patient/family education     PT Goals(Current goals can be found in the care plan section) Acute Rehab PT Goals Patient Stated Goal: Per son to return to ALF PT Goal Formulation: With patient/family Time For Goal Achievement: 04/19/13 Potential to Achieve Goals: Good  Visit Information  Last PT Received On: 04/05/13 Assistance Needed: +1 History of Present Illness: pt presents with falls, Dizziness, and hx of Dementia.         Prior Functioning  Home Living Family/patient expects to be discharged to:: Assisted living Home Equipment: Walker - 2 wheels Additional Comments: Per son pt is able to get as much A as needed at ALF.   Prior Function Level of Independence: Needs assistance Gait / Transfers Assistance Needed: Uses RW.   ADL's / Homemaking Assistance Needed: pt performs as much ADLs as able and staff only A as needed.   Communication Communication: HOH    Cognition  Cognition Arousal/Alertness: Awake/alert Behavior During Therapy: WFL for tasks assessed/performed Overall Cognitive Status: History of cognitive impairments - at baseline    Extremity/Trunk Assessment Upper Extremity Assessment Upper Extremity Assessment: Overall WFL for tasks assessed Lower Extremity Assessment Lower Extremity Assessment: Overall WFL for tasks assessed Cervical / Trunk Assessment Cervical / Trunk Assessment: Kyphotic   Balance Balance Overall balance assessment: Needs assistance Standing balance support: Bilateral upper extremity supported Standing balance-Leahy Scale: Poor  End of Session PT - End of Session Equipment Utilized During Treatment: Gait belt Activity Tolerance: Patient tolerated treatment well Patient left: in chair;with call bell/phone within reach;with nursing/sitter in room;with family/visitor  present Nurse  Communication: Mobility status  GP     [name], [place] [phone] 04/05/2013, 12:09 PM

## 2013-04-05 NOTE — Progress Notes (Signed)
Patient ID: [name]  female  ZOX:[id number]    DOB: January 05, 1917    DOA: 04/04/2013  PCP: No primary provider on file.  Assessment/Plan: Principal Problem:   Near syncope resulting in fall: Likely due to orthostatic hypotension - Troponin x1 negative, BNP elevated at 1302, will check echo - Orthostatic vitals checked today, she is orthostatic positive, will hold her Lasix dose, no IV fluids and with history of CHF - PT evaluation, I's/O's  Active Problems:   HYPERLIPIDEMIA-MIXED  Orthostatic hypotension: She has a history of hypertension BP on standing 96/48, on sitting 143/63 - Placed compression stockings, knee high - Hold off on Lasix dose today.    CONGESTIVE HEART FAILURE, LEFT: Currently stable - Not in any fluid overload, hold Lasix, check 2-D echocardiogram    Fall - PT/OT eval  -Patient has bruise on her left elbow and arm, about her left eye. [facility]    DVT Prophylaxis:  Code Status:Full code  Family Communication:Discussed in detail with patient's son at the bedside  Disposition:  Consultants:  None  Procedures:  None  Antibiotics:  Bactrim which she is chronically on    Subjective: Currently she is feeling somewhat better, no chest pain or shortness of breath still orthostatic  Objective: Weight change:  No intake or output data in the 24 hours ending 04/05/13 1147 Blood pressure 143/63, pulse 75, temperature 98 F (36.7 C), temperature source Oral, resp. rate 19, height 5\' 2"  (1.575 m), weight 60.238 kg (132 lb 12.8 oz), SpO2 98.00%.  Physical Exam: General: Alert and awake, oriented x3, not in any acute distress.Hearing deficit on the left side, hematoma above the left eyebrow  CVS: S1-S2 clear, no murmur rubs or gallops Chest: clear to auscultation bilaterally, no wheezing, rales or rhonchi Abdomen: soft nontender, nondistended, normal bowel sounds  Extremities: no cyanosis, clubbing or edema noted  bilaterallyLeft arm dressing intact  Neuro: Cranial nerves II-XII intact, no focal neurological deficits  Lab Results: Basic Metabolic Panel:  Recent Labs Lab 04/04/13 1735 04/05/13 0622  NA 134* 135*  K 4.4 3.9  CL 95* 99  CO2 25 22  GLUCOSE 126* 98  BUN 22 17  CREATININE 1.04 0.94  CALCIUM 9.1 9.4   Liver Function Tests: No results found for this basename: AST, ALT, ALKPHOS, BILITOT, PROT, ALBUMIN,  in the last 168 hours No results found for this basename: LIPASE, AMYLASE,  in the last 168 hours No results found for this basename: AMMONIA,  in the last 168 hours CBC:  Recent Labs Lab 04/04/13 1735 04/05/13 0622  WBC 7.8 8.6  NEUTROABS 5.5  --   HGB 11.0* 10.6*  HCT 33.6* 32.7*  MCV 81.0 80.3  PLT 308 296   Cardiac Enzymes:  Recent Labs Lab 04/05/13 0622  TROPONINI <0.30   BNP: No components found with this basename: POCBNP,  CBG:  Recent Labs Lab 04/04/13 1725  GLUCAP 123*     Micro Results: Recent Results (from the past 240 hour(s))  MRSA PCR SCREENING     Status: None   Collection Time    04/04/13 11:42 PM      Result Value Ref Range Status   MRSA by PCR NEGATIVE  NEGATIVE Final   Comment:            The GeneXpert MRSA Assay (FDA     approved for NASAL specimens     only), is one component of a     comprehensive MRSA colonization  surveillance program. It is not     intended to diagnose MRSA     infection nor to guide or     monitor treatment for     MRSA infections.    Studies/Results: Dg Elbow Complete Left  04/04/2013   CLINICAL DATA:  Larey Seat.  Elbow pain.  EXAM: LEFT ELBOW - COMPLETE 3+ VIEW  COMPARISON:  None.  FINDINGS: The joint spaces are maintained. Mild degenerative changes. No acute fracture or osteochondral lesion. No joint effusion.  IMPRESSION: Mild degenerative changes but no acute fracture or joint effusion.   Electronically Signed   By: Loralie Champagne M.D.   On: 04/04/2013 18:49   Ct Head Wo Contrast  04/04/2013    CLINICAL DATA:  Left forehead hematoma following a fall and hitting her head today.  EXAM: CT HEAD WITHOUT CONTRAST  TECHNIQUE: Contiguous axial images were obtained from the base of the skull through the vertex without intravenous contrast.  COMPARISON:  03/30/2012.  FINDINGS: Large left frontal scalp hematoma. No skull fracture, intracranial hemorrhage or paranasal sinus air-fluid levels. Diffusely enlarged ventricles and subarachnoid spaces. Patchy white matter low density in both cerebral hemispheres. Patchy low density in the midbrain, pons and proximal medulla. Bilateral ethmoid sinus mucosal thickening.  IMPRESSION: 1. Large left frontal scalp hematoma. 2. No skull fracture or intracranial hemorrhage. 3. Progressive atrophy and chronic small vessel ischemic changes.   Electronically Signed   By: Gordan Payment M.D.   On: 04/04/2013 18:20    Medications: Scheduled Meds: . aspirin  81 mg Oral Daily  . carvedilol  25 mg Oral BID  . enoxaparin (LOVENOX) injection  40 mg Subcutaneous Q24H  . lisinopril  20 mg Oral Daily  . sertraline  100 mg Oral q morning - 10a  . sodium chloride  3 mL Intravenous Q12H  . sulfamethoxazole-trimethoprim  1 tablet Oral Daily      LOS: 1 day   Mana Morison M.D. [facility] 04/05/2013, 11:47 AM Pager: [phone]  If 7PM-7AM, please contact night-coverage [email or web address] Password [email or web address]

## 2013-04-05 NOTE — Progress Notes (Signed)
INITIAL NUTRITION ASSESSMENT  DOCUMENTATION CODES Per approved criteria  -Not Applicable   INTERVENTION: - Ensure Complete BID - Unit RD to continue to monitor   NUTRITION DIAGNOSIS: Increased nutrient needs related to fall with left sided head/arm injuries as evidenced by MD notes.   Goal: Pt to consume >90% of meals/supplements  Monitor:  Weights, labs, intake  Reason for Assessment: Malnutrition screening tool, consult for assessment  80 y.o. female  Admitting Dx: Near syncope  ASSESSMENT: Pt with Alzheimer's dementia, HTN, diastolic CHF (grade I) per last 2 D ECHO in 2011, presenting to [facility] ED after sustaining an episode of fall at home. She explains she was trying to get some stuff from her closet and suddenly felt dizzy and fell to the floor. She denies LOC but explains she hit her head and has hit left arm, left leg. ED notes say pt is from [facility].   Met with pt who reports eating "what they served me" PTA. Was not on nutritional supplements. No PO intake documented. Pt thinks she may have lost weight recently but is unsure how much.    Height: Ht Readings from Last 1 Encounters:  04/04/13 _0  (1.575 m)    Weight: Wt Readings from Last 1 Encounters:  04/04/13 132 lb 12.8 oz (60.238 kg)    Ideal Body Weight: 110 lb   % Ideal Body Weight: 120%  Wt Readings from Last 10 Encounters:  04/04/13 132 lb 12.8 oz (60.238 kg)  05/20/10 164 lb 12.8 oz (74.753 kg)  12/31/09 165 lb (74.844 kg)  06/25/09 167 lb (75.751 kg)  01/04/09 171 lb (77.565 kg)  05/04/08 171 lb (77.565 kg)    Usual Body Weight: No recent weights   BMI:  Body mass index is 24.28 kg/(m^2).  Estimated Nutritional Needs: Kcal: 1500-1700 Protein: 70-85g Fluid: 1.5-1.7L/day  Skin: Head laceration on left side, left arm skin tear  Diet Order: General  EDUCATION NEEDS: -No education needs identified at this time  No intake or output data in the 24 hours ending  04/05/13 1620  Last BM: Intact  Labs:   Recent Labs Lab 04/04/13 1735 04/05/13 0622  NA 134* 135*  K 4.4 3.9  CL 95* 99  CO2 25 22  BUN 22 17  CREATININE 1.04 0.94  CALCIUM 9.1 9.4  GLUCOSE 126* 98    CBG (last 3)   Recent Labs  04/04/13 1725  GLUCAP 123*    Scheduled Meds: . aspirin  81 mg Oral Daily  . carvedilol  25 mg Oral BID  . enoxaparin (LOVENOX) injection  40 mg Subcutaneous Q24H  . lisinopril  20 mg Oral Daily  . sertraline  100 mg Oral q morning - 10a  . sodium chloride  3 mL Intravenous Q12H  . sulfamethoxazole-trimethoprim  1 tablet Oral Daily    Continuous Infusions:   Past Medical History  Diagnosis Date  . CHF (congestive heart failure)     due to probable non-ischemic cardiomyopathy (recovered)  a. ECHO 02/2006: EF 15-20% b. ECHO 12/2006 . EF 60%   . Non-ischemic cardiomyopathy   . HTN (hypertension)     previously severe  . Iron deficiency anemia     a. refuses endoscopy  . Dementia     very mild  . Dizziness   . Depression   . Hearing loss   . H/O: hysterectomy   . Dementia   . Alzheimer disease     History reviewed. No pertinent past surgical history.  Mikey College MS, RD, LDN [phone] Weekend/After Hours Pager

## 2013-04-06 DIAGNOSIS — I369 Nonrheumatic tricuspid valve disorder, unspecified: Secondary | ICD-10-CM

## 2013-04-06 DIAGNOSIS — E785 Hyperlipidemia, unspecified: Secondary | ICD-10-CM

## 2013-04-06 LAB — URINALYSIS, ROUTINE W REFLEX MICROSCOPIC
Bilirubin Urine: NEGATIVE
GLUCOSE, UA: NEGATIVE mg/dL
Ketones, ur: NEGATIVE mg/dL
Nitrite: NEGATIVE
PH: 6 (ref 5.0–8.0)
Protein, ur: NEGATIVE mg/dL
Specific Gravity, Urine: 1.018 (ref 1.005–1.030)
Urobilinogen, UA: 0.2 mg/dL (ref 0.0–1.0)

## 2013-04-06 LAB — URINE MICROSCOPIC-ADD ON

## 2013-04-06 MED ORDER — FUROSEMIDE 20 MG PO TABS
20.0000 mg | ORAL_TABLET | Freq: Every day | ORAL | Status: DC
Start: 2013-04-07 — End: 2013-04-06

## 2013-04-06 MED ORDER — FUROSEMIDE 20 MG PO TABS: 20.0000 mg | ORAL_TABLET | Freq: Every day | ORAL | Status: DC

## 2013-04-06 MED ORDER — LORAZEPAM 0.5 MG PO TABS: 0.5000 mg | ORAL_TABLET | Freq: Two times a day (BID) | ORAL | Status: DC | PRN

## 2013-04-06 MED ORDER — CARVEDILOL 12.5 MG PO TABS: 12.5000 mg | ORAL_TABLET | Freq: Two times a day (BID) | ORAL | Status: AC

## 2013-04-06 MED ORDER — ROPINIROLE HCL 0.25 MG PO TABS: 0.2500 mg | ORAL_TABLET | Freq: Every day | ORAL | Status: DC

## 2013-04-06 NOTE — Progress Notes (Signed)
Patient ID: [name]  female  EAV:[id number]    DOB: [birth year]    DOA: 04/04/2013  PCP: Kaleen MaskELKINS,WILSON OLIVER, MD  Assessment/Plan: Principal Problem:   Near syncope resulting in fall: Likely due to orthostatic hypotension - Troponin x3 negative, BNP elevated at 1302, echocardiogram still pending - Patient's BP is borderline low 103/46, I had discontinued Lasix, DC'd lisinopril today. Hold off on Coreg. I think her outpatient antihypertensives need to be decreased to allow somewhat higher BP. - Placed compression stockings - PT evaluation, I's/O's - Will follow urine culture, she is currently on Bactrim empirically  Active Problems:   HYPERLIPIDEMIA-MIXED  Orthostatic hypotension:  - Placed compression stockings, knee high - Had Lasix dose outpatient needs to be decreased, I strongly recommended patient's son to make an appointment with her PCP and Dr. Gala RomneyBensimhon.    CONGESTIVE HEART FAILURE, LEFT: Currently stable - Not in any fluid overload, hold Lasix, check 2-D echocardiogram    Fall - PT/OT eval  -Patient has bruise on her left elbow and arm, about her left eye. Currently at [facility].   Restless leg syndrome - Patient started on Requip at bedtime which had tremendous improvement, does needs to be titrated outpatient  DVT Prophylaxis:  Code Status:Full code  Family Communication:Discussed in detail with patient's son at the bedside  Disposition:  Consultants:  None  Procedures:  None  Antibiotics:  Bactrim which she is chronically on    Subjective: Looking much better today, sitting up in the chair  Objective: Weight change: -2.54 kg (-5 lb 9.6 oz)  Intake/Output Summary (Last 24 hours) at 04/06/13 1134 Last data filed at 04/06/13 0100  Gross per 24 hour  Intake      0 ml  Output    225 ml  Net   -225 ml   Blood pressure 103/46, pulse 93, temperature 98.1 F (36.7 C), temperature source Oral, resp. rate 18, height 5\' 2"   (1.575 m), weight 57.698 kg (127 lb 3.2 oz), SpO2 93.00%.  Physical Exam: General: Alert and awake, oriented x3, not in any acute distress.Hearing deficit on the left side, hematoma above the left eyebrow  CVS: S1-S2 clear, no murmur rubs or gallops Chest: clear to auscultation bilaterally, no wheezing, rales or rhonchi Abdomen: soft nontender, nondistended, normal bowel sounds  Extremities: no cyanosis, clubbing or edema noted bilaterallyLeft arm dressing intact    Lab Results: Basic Metabolic Panel:  Recent Labs Lab 04/04/13 1735 04/05/13 0622  NA 134* 135*  K 4.4 3.9  CL 95* 99  CO2 25 22  GLUCOSE 126* 98  BUN 22 17  CREATININE 1.04 0.94  CALCIUM 9.1 9.4   Liver Function Tests: No results found for this basename: AST, ALT, ALKPHOS, BILITOT, PROT, ALBUMIN,  in the last 168 hours No results found for this basename: LIPASE, AMYLASE,  in the last 168 hours No results found for this basename: AMMONIA,  in the last 168 hours CBC:  Recent Labs Lab 04/04/13 1735 04/05/13 0622  WBC 7.8 8.6  NEUTROABS 5.5  --   HGB 11.0* 10.6*  HCT 33.6* 32.7*  MCV 81.0 80.3  PLT 308 296   Cardiac Enzymes:  Recent Labs Lab 04/05/13 0622 04/05/13 1332 04/05/13 1917  TROPONINI <0.30 <0.30 <0.30   BNP: No components found with this basename: POCBNP,  CBG:  Recent Labs Lab 04/04/13 1725  GLUCAP 123*     Micro Results: Recent Results (from the past 240 hour(s))  MRSA PCR SCREENING  Status: None   Collection Time    04/04/13 11:42 PM      Result Value Ref Range Status   MRSA by PCR NEGATIVE  NEGATIVE Final   Comment:            The GeneXpert MRSA Assay (FDA     approved for NASAL specimens     only), is one component of a     comprehensive MRSA colonization     surveillance program. It is not     intended to diagnose MRSA     infection nor to guide or     monitor treatment for     MRSA infections.    Studies/Results: Dg Elbow Complete Left  04/04/2013    CLINICAL DATA:  Larey Seat.  Elbow pain.  EXAM: LEFT ELBOW - COMPLETE 3+ VIEW  COMPARISON:  None.  FINDINGS: The joint spaces are maintained. Mild degenerative changes. No acute fracture or osteochondral lesion. No joint effusion.  IMPRESSION: Mild degenerative changes but no acute fracture or joint effusion.   Electronically Signed   By: Loralie Champagne M.D.   On: 04/04/2013 18:49   Ct Head Wo Contrast  04/04/2013   CLINICAL DATA:  Left forehead hematoma following a fall and hitting her head today.  EXAM: CT HEAD WITHOUT CONTRAST  TECHNIQUE: Contiguous axial images were obtained from the base of the skull through the vertex without intravenous contrast.  COMPARISON:  03/30/2012.  FINDINGS: Large left frontal scalp hematoma. No skull fracture, intracranial hemorrhage or paranasal sinus air-fluid levels. Diffusely enlarged ventricles and subarachnoid spaces. Patchy white matter low density in both cerebral hemispheres. Patchy low density in the midbrain, pons and proximal medulla. Bilateral ethmoid sinus mucosal thickening.  IMPRESSION: 1. Large left frontal scalp hematoma. 2. No skull fracture or intracranial hemorrhage. 3. Progressive atrophy and chronic small vessel ischemic changes.   Electronically Signed   By: Gordan Payment M.D.   On: 04/04/2013 18:20    Medications: Scheduled Meds: . aspirin  81 mg Oral Daily  . carvedilol  12.5 mg Oral BID WC  . enoxaparin (LOVENOX) injection  40 mg Subcutaneous Q24H  . feeding supplement (ENSURE COMPLETE)  237 mL Oral BID BM  . rOPINIRole  0.25 mg Oral QHS  . sertraline  100 mg Oral q morning - 10a  . sulfamethoxazole-trimethoprim  1 tablet Oral Daily      LOS: 2 days   Demetries Coia M.D. [facility] 04/06/2013, 11:34 AM Pager: [phone]  If 7PM-7AM, please contact night-coverage [email or web address] Password [email or web address]

## 2013-04-06 NOTE — Progress Notes (Signed)
  Echocardiogram 2D Echocardiogram has been performed.  [name] 04/06/2013, 9:21 AM

## 2013-04-06 NOTE — Discharge Summary (Signed)
Physician Discharge Summary  Patient ID: [name] MRN: [id number] DOB/AGE: Feb 25, 1916 80 y.o.  Admit date: 04/04/2013 Discharge date: 04/06/2013  Primary Care Physician:  Kaleen MaskELKINS,WILSON OLIVER, MD  Discharge Diagnoses:     Near-syncope likely due to orthostatic hypotension  . Fall . Orthostatic hypotension . restless leg syndrome  . HYPERLIPIDEMIA-MIXED . CONGESTIVE HEART FAILURE, LEFT  Consults:  None   Recommendations for Outpatient Follow-up:  Please address patient's antihypertensive/cardiac medications they need to be decreased to a low BP to improve.  She is started on Requip for restless leg syndrome, please titrate dose.   Allergies:  No Known Allergies   Discharge Medications:   Medication List    STOP taking these medications       lisinopril 20 MG tablet  Commonly known as:  PRINIVIL,ZESTRIL      TAKE these medications       aspirin 81 MG EC tablet  Take 81 mg by mouth daily.     carvedilol 12.5 MG tablet  Commonly known as:  COREG  Take 1 tablet (12.5 mg total) by mouth 2 (two) times daily with a meal.     furosemide 20 MG tablet  Commonly known as:  LASIX  Take 1 tablet (20 mg total) by mouth daily.  Start taking on:  04/07/2013     guaiFENesin-dextromethorphan 100-10 MG/5ML syrup  Commonly known as:  ROBITUSSIN DM  Take 10 mLs by mouth every 4 (four) hours as needed for cough.     loperamide 2 MG capsule  Commonly known as:  IMODIUM  Take 2 mg by mouth as needed for diarrhea or loose stools (*max of 16mg  per 34 hours*).     LORazepam 0.5 MG tablet  Commonly known as:  ATIVAN  Take 1 tablet (0.5 mg total) by mouth every 12 (twelve) hours as needed for anxiety or sleep.     rOPINIRole 0.25 MG tablet  Commonly known as:  REQUIP  Take 1 tablet (0.25 mg total) by mouth at bedtime.     sertraline 100 MG tablet  Commonly known as:  ZOLOFT  Take 100 mg by mouth every morning.     sulfamethoxazole-trimethoprim 800-160 MG per tablet   Commonly known as:  BACTRIM DS  Take 1 tablet by mouth daily. *for recurrent UTI*         Brief H and P: For complete details please refer to admission H and P, but in brief Pt is 80 yo female with Alzheimer's dementia, HTN, diastolic CHF (grade I) per last 2 D ECHO in 2011, [facility] ED after sustaining an episode of fall at home. She explains she was trying to get some stuff from her closet and suddenly felt dizzy and fell to the floor. She denied LOC but explained she hit her head and has hit left arm, left leg. She denies chest pain, shortness of breat, no specific abdominal or urinary concerns, no specific focal neurological symptoms, no fevers, chills, no similar events in the recent past. At baseline uses walker to assist with ambulation.  In ED, pt noted to be hemodynamically stable but initial BP noted to be 206/104 mmHg. Pt also noted to have hematoma on the left side of the head with skin tear to left arm and hematoma to left knee.    Hospital Course:  Near syncope resulting in fall: Likely due to orthostatic hypotension. She was admitted to telemetry, ruled out for acute coronary syndrome. Has Troponin x3 negative, BNP elevated at 1302.  echocardiogram was done which showed EF of 55-60%, grade 1 diastolic dysfunction.  Patient's BP remained borderline low 103/46 and with orthostasis. I had held Lasix during the hospitalization, discontinued lisinopril and decreased Coreg dose to 12.5 mg BID.  I think her outpatient antihypertensives need to be decreased to allow somewhat higher BP to avoid orthostasis and dizziness when she stands up. Placed knee high compression stockings. PT evaluation recommended continuing physical therapy at ALF. I strongly recommended patient's son to make an appointment with her PCP and Dr. Gala Romney.    HYPERLIPIDEMIA-MIXED    CONGESTIVE HEART FAILURE, LEFT: Currently stable - Not in any fluid overload during hospitalization, I have decreased the Lasix  to  daily. Please follow up with cardiology outpatient to readjust medications  Fall  - PT/OT eval rendered continuing physical therapy at ALF -Patient has bruise on her left elbow and arm, about her left eye. Currently at [facility].   Restless leg syndrome - Patient started on Requip at bedtime which had tremendous improvement, does needs to be titrated outpatient      Day of Discharge BP 129/72  Pulse 76  Temp(Src) 98.3 F (36.8 C) (Oral)  Resp 17  Ht  (1.575 m)  Wt 57.698 kg (127 lb 3.2 oz)  BMI 23.26 kg/m2  SpO2 93%  Physical Exam:  Neuro: Cranial nerves II-XII intact, no focal neurological deficits General: Alert and awake, oriented x3, NAD.Hearing deficit on the left side, hematoma above the left eyebrow  CVS: S1-S2 clear, no murmur rubs or gallops  Chest: clear to auscultation bilaterally, no wheezing, rales or rhonchi  Abdomen: soft nontender, nondistended, normal bowel sounds  Extremities: no cyanosis, clubbing or edema noted bilaterallyLeft arm dressing intact      The results of significant diagnostics from this hospitalization (including imaging, microbiology, ancillary and laboratory) are listed below for reference.    LAB RESULTS: Basic Metabolic Panel:  Recent Labs Lab 04/04/13 1735 04/05/13 0622  NA 134* 135*  K 4.4 3.9  CL 95* 99  CO2 25 22  GLUCOSE 126* 98  BUN 22 17  CREATININE 1.04 0.94  CALCIUM 9.1 9.4   Liver Function Tests: No results found for this basename: AST, ALT, ALKPHOS, BILITOT, PROT, ALBUMIN,  in the last 168 hours No results found for this basename: LIPASE, AMYLASE,  in the last 168 hours No results found for this basename: AMMONIA,  in the last 168 hours CBC:  Recent Labs Lab 04/04/13 1735 04/05/13 0622  WBC 7.8 8.6  NEUTROABS 5.5  --   HGB 11.0* 10.6*  HCT 33.6* 32.7*  MCV 81.0 80.3  PLT 308 296   Cardiac Enzymes:  Recent Labs Lab 04/05/13 1332 04/05/13 1917  TROPONINI <0.30 <0.30    BNP: No components found with this basename: POCBNP,  CBG:  Recent Labs Lab 04/04/13 1725  GLUCAP 123*    Significant Diagnostic Studies:  Dg Elbow Complete Left  04/04/2013   CLINICAL DATA:  Larey Seat.  Elbow pain.  EXAM: LEFT ELBOW - COMPLETE 3+ VIEW  COMPARISON:  None.  FINDINGS: The joint spaces are maintained. Mild degenerative changes. No acute fracture or osteochondral lesion. No joint effusion.  IMPRESSION: Mild degenerative changes but no acute fracture or joint effusion.   Electronically Signed   By: Loralie Champagne M.D.   On: 04/04/2013 18:49   Ct Head Wo Contrast  04/04/2013   CLINICAL DATA:  Left forehead hematoma following a fall and hitting her head today.  EXAM: CT HEAD  WITHOUT CONTRAST  TECHNIQUE: Contiguous axial images were obtained from the base of the skull through the vertex without intravenous contrast.  COMPARISON:  03/30/2012.  FINDINGS: Large left frontal scalp hematoma. No skull fracture, intracranial hemorrhage or paranasal sinus air-fluid levels. Diffusely enlarged ventricles and subarachnoid spaces. Patchy white matter low density in both cerebral hemispheres. Patchy low density in the midbrain, pons and proximal medulla. Bilateral ethmoid sinus mucosal thickening.  IMPRESSION: 1. Large left frontal scalp hematoma. 2. No skull fracture or intracranial hemorrhage. 3. Progressive atrophy and chronic small vessel ischemic changes.   Electronically Signed   By: Gordan Payment M.D.   On: 04/04/2013 18:20    2D ECHO: Study Conclusions  - Left ventricle: The cavity size was normal. Wall thickness was increased in a pattern of severe LVH. Systolic function was normal. The estimated ejection fraction was in the range of 55% to 60%. Wall motion was normal; there were no regional wall motion abnormalities. Due to tachycardia, there was fusion of early and atrial contributions to ventricular filling. Doppler parameters are consistent with abnormal left ventricular  relaxation (grade 1 diastolic dysfunction). - Mitral valve: Calcified annulus. Mild regurgitation. - Right atrium: The atrium was moderately dilated. - Tricuspid valve: Moderate regurgitation. - Pulmonary arteries: PA peak pressure: 63mm Hg (S).    Disposition and Follow-up:     Discharge Orders   Future Orders Complete By Expires   Diet general  As directed    Discharge instructions  As directed    Comments:     Please wear Ted Hoses knee high for circulation   Please discuss with Dr Jeannetta Nap regarding BP medications and restless leg syndrome   Increase activity slowly  As directed        DISPOSITION: ALF   DIET: *Regular  DISCHARGE FOLLOW-UP Follow-up Information   Follow up with Kaleen Mask, MD. Schedule an appointment as soon as possible for a visit in 10 days.   Specialty:  Family Medicine   Contact information:   [address] [place] [address] [phone]       Follow up with Arvilla Meres, MD. Schedule an appointment as soon as possible for a visit in 2 weeks. (for hospital follow-up)    Specialty:  Cardiology   Contact information:   [address] [place] [address] [phone]       Time spent on Discharge: 40 mins  Signed:   RAI,RIPUDEEP M.D. [facility] 04/06/2013, 4:20 PM Pager: [phone]

## 2013-04-07 LAB — URINE CULTURE

## 2013-04-16 ENCOUNTER — Ambulatory Visit (HOSPITAL_COMMUNITY)
Admission: RE | Admit: 2013-04-16 | Discharge: 2013-04-16 | Disposition: A | Discharge status: Home / Self Care | Payer: Medicare Other | Source: Ambulatory Visit | Attending: Internal Medicine | Admitting: Internal Medicine

## 2013-04-16 VITALS — BP 108/52 | HR 90 | Wt 130.0 lb

## 2013-04-16 DIAGNOSIS — I509 Heart failure, unspecified: Secondary | ICD-10-CM | POA: Insufficient documentation

## 2013-04-16 DIAGNOSIS — I501 Left ventricular failure: Secondary | ICD-10-CM

## 2013-04-16 DIAGNOSIS — I10 Essential (primary) hypertension: Secondary | ICD-10-CM | POA: Insufficient documentation

## 2013-04-16 NOTE — Progress Notes (Signed)
Patient ID: [name], female   DOB: 1916/09/11, 80 y.o.   MRN: [id number] HPI:  [name] is a delightful 80 year old woman with a  history of Alzheimer's, congestive heart failures due to probable nonischemic cardiomyopathy, EF was about 20%, right now normalized to 60%.  She also has a history of hypertension, hyperlipidemia, and iron-deficiency anemia, for which she has refused endoscopy.  We have not seen her since 2012.  About 2 weeks got her feet tangled in a rocking chair and had a fall with injury to her forehead, face and arm. No CP, SOB or palpitations. Brought to ER BP 206/104. Which came down quickly. ECHO EF 55-60% mod TR. On discharge carvedilol decreased to 12.5 bid and lisinopril stopped.    BP at SNF: 94/60 to 168/93.  Average reading 107/55  ROS: All systems negative except as listed in HPI, PMH and Problem List.  Past Medical History  Diagnosis Date  . CHF (congestive heart failure)     due to probable non-ischemic cardiomyopathy (recovered)  a. ECHO 02/2006: EF 15-20% b. ECHO 12/2006 . EF 60%   . Non-ischemic cardiomyopathy   . HTN (hypertension)     previously severe  . Iron deficiency anemia     a. refuses endoscopy  . Dementia     very mild  . Dizziness   . Depression   . Hearing loss   . H/O: hysterectomy   . Dementia   . Alzheimer disease     Current Outpatient Prescriptions  Medication Sig Dispense Refill  . aspirin 81 MG EC tablet Take 81 mg by mouth daily.        . carvedilol (COREG) 12.5 MG tablet Take 1 tablet (12.5 mg total) by mouth 2 (two) times daily with a meal.  60 tablet  3  . furosemide (LASIX) 20 MG tablet Take 40 mg by mouth daily.      Marland Kitchen guaiFENesin-dextromethorphan (ROBITUSSIN DM) 100-10 MG/5ML syrup Take 10 mLs by mouth every 4 (four) hours as needed for cough.      . loperamide (IMODIUM) 2 MG capsule Take 2 mg by mouth as needed for diarrhea or loose stools (*max of 16mg  per 34 hours*).      . LORazepam (ATIVAN) 0.5 MG tablet Take  1 tablet (0.5 mg total) by mouth every 12 (twelve) hours as needed for anxiety or sleep.  30 tablet  0  . rOPINIRole (REQUIP) 0.25 MG tablet Take 1 tablet (0.25 mg total) by mouth at bedtime.  30 tablet  3  . sertraline (ZOLOFT) 100 MG tablet Take 100 mg by mouth every morning.      . sulfamethoxazole-trimethoprim (BACTRIM DS) 800-160 MG per tablet Take 1 tablet by mouth daily. *for recurrent UTI*       No current facility-administered medications for this encounter.     PHYSICAL EXAM: Filed Vitals:   04/16/13 1332  BP: 108/52  Pulse: 90   General:  Elderly. Sitting in WC HEENT: normal large hematoma L forehead and bruise on left cheek Neck: supple. no JVD. Carotids 2+ bilat; no bruits. No lymphadenopathy or thryomegaly appreciated. Cor: PMI nondisplaced. Regular rate & rhythm. No rubs, gallops, murmur. Lungs: clear Abdomen: soft, nontender, nondistended. No hepatosplenomegaly. No bruits or masses. Good bowel sounds. Extremities: no cyanosis, clubbing, rash, no edema. Skin tear on L arm Neuro: alert & orientedx3, cranial nerves grossly intact. moves all 4 extremities w/o difficulty. affect pleasant  ASSESSMENT & PLAN: 1. H/o systolic HF - resolved. Recent echo  with normal LV function. No clinical HF 2.  HTN - BP somewhat labile but overall on the low end. Will ask [facility] to check BPs every am. If SBP < 110 hold am carvedilol 3.  Fall - this was mechanical fall. No evidence arrhythmia   [name] Jedlicka,MD 2:01 PM

## 2013-08-12 ENCOUNTER — Emergency Department (HOSPITAL_COMMUNITY)
Admission: EM | Admit: 2013-08-12 | Discharge: 2013-08-12 | Disposition: A | Discharge status: Home / Self Care | Payer: Medicare Other | Attending: Emergency Medicine | Admitting: Emergency Medicine

## 2013-08-12 ENCOUNTER — Encounter (HOSPITAL_COMMUNITY): Payer: Self-pay | Admitting: Emergency Medicine

## 2013-08-12 DIAGNOSIS — I509 Heart failure, unspecified: Secondary | ICD-10-CM | POA: Diagnosis not present

## 2013-08-12 DIAGNOSIS — R296 Repeated falls: Secondary | ICD-10-CM | POA: Insufficient documentation

## 2013-08-12 DIAGNOSIS — G309 Alzheimer's disease, unspecified: Secondary | ICD-10-CM | POA: Diagnosis not present

## 2013-08-12 DIAGNOSIS — F0280 Dementia in other diseases classified elsewhere without behavioral disturbance: Secondary | ICD-10-CM | POA: Insufficient documentation

## 2013-08-12 DIAGNOSIS — I428 Other cardiomyopathies: Secondary | ICD-10-CM | POA: Diagnosis not present

## 2013-08-12 DIAGNOSIS — D509 Iron deficiency anemia, unspecified: Secondary | ICD-10-CM | POA: Diagnosis not present

## 2013-08-12 DIAGNOSIS — Z7902 Long term (current) use of antithrombotics/antiplatelets: Secondary | ICD-10-CM | POA: Diagnosis not present

## 2013-08-12 DIAGNOSIS — Z79899 Other long term (current) drug therapy: Secondary | ICD-10-CM | POA: Diagnosis not present

## 2013-08-12 DIAGNOSIS — Z7982 Long term (current) use of aspirin: Secondary | ICD-10-CM | POA: Insufficient documentation

## 2013-08-12 DIAGNOSIS — S0990XA Unspecified injury of head, initial encounter: Secondary | ICD-10-CM | POA: Diagnosis present

## 2013-08-12 DIAGNOSIS — H9190 Unspecified hearing loss, unspecified ear: Secondary | ICD-10-CM | POA: Diagnosis not present

## 2013-08-12 DIAGNOSIS — F3289 Other specified depressive episodes: Secondary | ICD-10-CM | POA: Diagnosis not present

## 2013-08-12 DIAGNOSIS — I429 Cardiomyopathy, unspecified: Secondary | ICD-10-CM

## 2013-08-12 DIAGNOSIS — F0390 Unspecified dementia without behavioral disturbance: Secondary | ICD-10-CM

## 2013-08-12 DIAGNOSIS — F329 Major depressive disorder, single episode, unspecified: Secondary | ICD-10-CM | POA: Diagnosis not present

## 2013-08-12 DIAGNOSIS — I10 Essential (primary) hypertension: Secondary | ICD-10-CM | POA: Diagnosis not present

## 2013-08-12 DIAGNOSIS — W19XXXA Unspecified fall, initial encounter: Secondary | ICD-10-CM

## 2013-08-12 DIAGNOSIS — Y9389 Activity, other specified: Secondary | ICD-10-CM | POA: Diagnosis not present

## 2013-08-12 DIAGNOSIS — Y9289 Other specified places as the place of occurrence of the external cause: Secondary | ICD-10-CM | POA: Diagnosis not present

## 2013-08-12 DIAGNOSIS — Z90710 Acquired absence of both cervix and uterus: Secondary | ICD-10-CM | POA: Diagnosis not present

## 2013-08-12 NOTE — ED Provider Notes (Signed)
CSN: [id number]     Arrival date & time 08/12/13  1601 History   First MD Initiated Contact with Patient 08/12/13 [phone]     No chief complaint on file.    (Consider location/radiation/quality/duration/timing/severity/associated sxs/prior Treatment) HPI Ms. Dienes is a 80 yo woman with multiple chronic medical problems including severe non-ischemic cardiomyopathy, dementia. She is BIB EMS from [facility] after a suspected fall. The patient has a history of frequent falls. She did not have a witnessed fall today. She was found alert but at baseline level of confusion on the floor in her room at living center.   The patient does not recall any fall. In fact, I am  Unable to obtain any meaningful history from the patient due advanced dementia. She denies pain.    Past Medical History  Diagnosis Date  . CHF (congestive heart failure)     due to probable non-ischemic cardiomyopathy (recovered)  a. ECHO 02/2006: EF 15-20% b. ECHO 12/2006 . EF 60%   . Non-ischemic cardiomyopathy   . HTN (hypertension)     previously severe  . Iron deficiency anemia     a. refuses endoscopy  . Dementia     very mild  . Dizziness   . Depression   . Hearing loss   . H/O: hysterectomy   . Dementia   . Alzheimer disease    No past surgical history on file. No family history on file. History  Substance Use Topics  . Smoking status: Never Smoker   . Smokeless tobacco: Not on file  . Alcohol Use: No   OB History   Grav Para Term Preterm Abortions TAB SAB Ect Mult Living                 Review of Systems  Ten point review of symptoms performed and is negative with the exception of symptoms noted above.   Allergies  Review of patient's allergies indicates no known allergies.  Home Medications   Prior to Admission medications   Medication Sig Start Date End Date Taking? Authorizing Provider  aspirin 81 MG EC tablet Take 81 mg by mouth daily.      Historical Provider, MD   carvedilol (COREG) 12.5 MG tablet Take 1 tablet (12.5 mg total) by mouth 2 (two) times daily with a meal. 04/06/13   Ripudeep K Rai, MD  furosemide (LASIX) 20 MG tablet Take 40 mg by mouth daily. 04/07/13   Ripudeep Jenna Luo, MD  guaiFENesin-dextromethorphan (ROBITUSSIN DM) 100-10 MG/5ML syrup Take 10 mLs by mouth every 4 (four) hours as needed for cough.    Historical Provider, MD  loperamide (IMODIUM) 2 MG capsule Take 2 mg by mouth as needed for diarrhea or loose stools (*max of 16mg  per 34 hours*).    Historical Provider, MD  LORazepam (ATIVAN) 0.5 MG tablet Take 1 tablet (0.5 mg total) by mouth every 12 (twelve) hours as needed for anxiety or sleep. 04/06/13   Ripudeep Jenna Luo, MD  rOPINIRole (REQUIP) 0.25 MG tablet Take 1 tablet (0.25 mg total) by mouth at bedtime. 04/06/13   Ripudeep Jenna Luo, MD  sertraline (ZOLOFT) 100 MG tablet Take 100 mg by mouth every morning.    Historical Provider, MD  sulfamethoxazole-trimethoprim (BACTRIM DS) 800-160 MG per tablet Take 1 tablet by mouth daily. *for recurrent UTI*    Historical Provider, MD   There were no vitals taken for this visit. Physical Exam  Gen: well developed and well nourished appearing, alert and oriented to  self.  Head: NCAT Eyes: PERL, EOMI Nose: no epistaixis or rhinorrhea Mouth/throat: mucosa is moist and pink, no signs of intra oral trauma.  Neck: supple, no stridor, no  c spine ttp Chest wall: nontender and without lesions Lungs: CTA B, no wheezing, rhonchi or rales CV: regular rate and rythm, good distal pulses.  Abd: soft, notender, nondistended Back: no midline ttp, no cva ttp Skin: warm and dry Ext: no edema, hemorrhagic blister over the anterior right lower leg which appears subacute and is nontender, normal to inspection. Full passive ROM of all major joints of all 4 extremities.  Neuro: CN ii-xii grossly intact, no focal deficits Psyche; normal affect,  calm and cooperative.  ED Course  Procedures (including critical care  time) Labs Review   MDM   Patient with advanced dementia found down but alert and at baseline mental status. No traumatic injuries identified. I do not feel any further work up indicated. Patient without complaints and is stable for discharge.    Brandt LoosenJulie Manly, MD 08/12/13 [phone]

## 2013-08-12 NOTE — ED Notes (Signed)
Per EMS: pt was found on the floor at the nursing home [facility]. Staff states that she may have hit her head and their policy is to send them out if they hit their head. Pt cannot confirm if she hit her head or fell out of the bed. Pt has severe dementia.

## 2013-08-12 NOTE — ED Notes (Signed)
Pt son came to check on mom, was called from nursing home.

## 2013-11-06 NOTE — Progress Notes (Signed)
Physical Therapy Note  Late entry for missed G-code.     04/05/13 1105  PT Visit Information  Last PT Received On 12/06/2013  PT G-Codes **NOT FOR INPATIENT CLASS**  Functional Assessment Tool Used Clinical Judgement  Functional Limitation Mobility: Walking and moving around  Mobility: Walking and Moving Around Current Status [phone]) CI  Mobility: Walking and Moving Around Goal Status [phone]) [facility]   Keniesha Adderly, PT [phone]

## 2014-04-24 DEATH — deceased

## 2014-05-12 IMAGING — CT CT HEAD W/O CM
1 of 2 series · 16 of 30 positions shown, 20 images · non-contrast
Comparison: 03/30/2012.

CLINICAL DATA: Left forehead hematoma following a fall and hitting
her head today.

EXAM:
CT HEAD WITHOUT CONTRAST
TECHNIQUE: Contiguous axial images were obtained from the base of the skull
through the vertex without intravenous contrast.

[Series 3: head 2.0 h70h · axial · 0.44mm/px · z∈[+1419,+1557]mm · 16 of 77 slices shown, 20 images]
[im 4/77  brain]
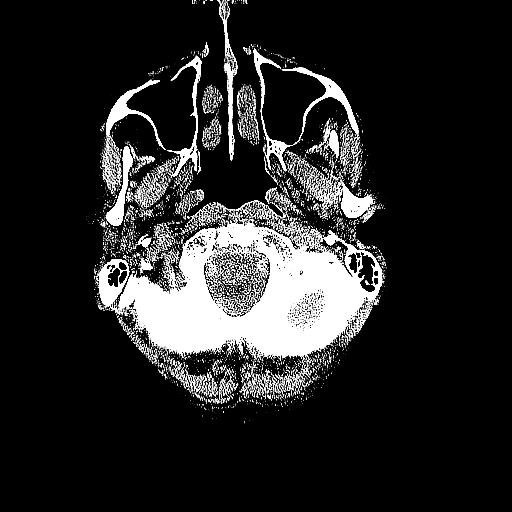
[im 4/77  bone]
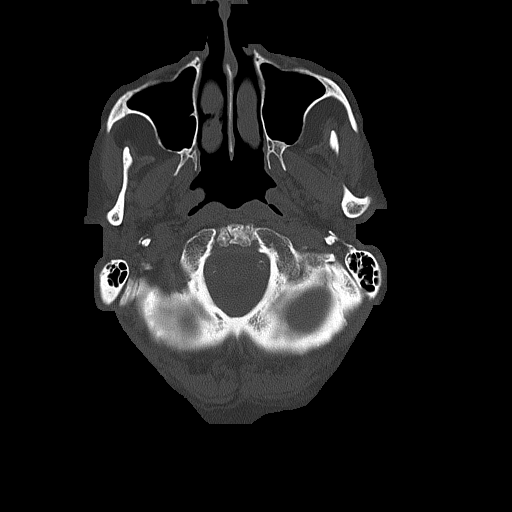
[im 8/77  brain]
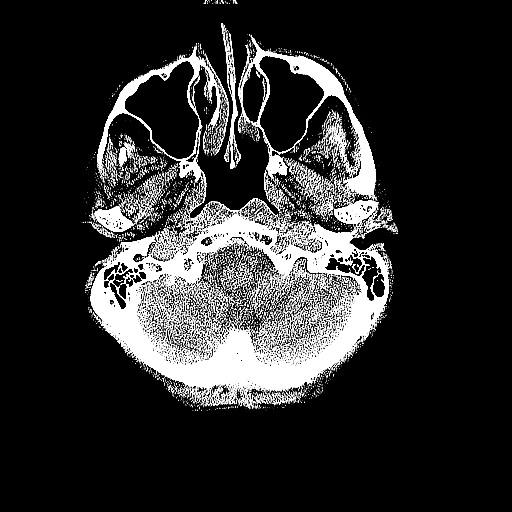
[im 12/77  brain]
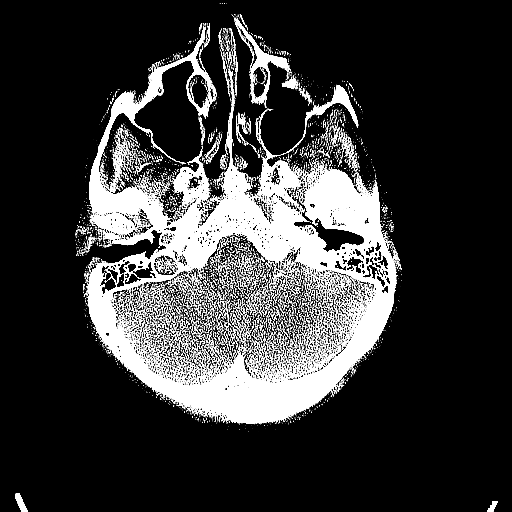
[im 20/77  brain]
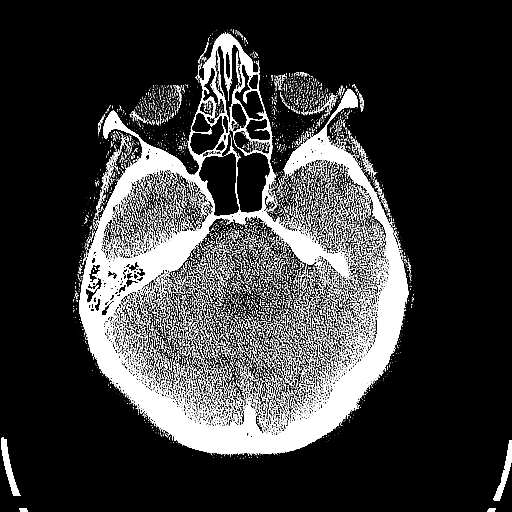
[im 23/77  brain]
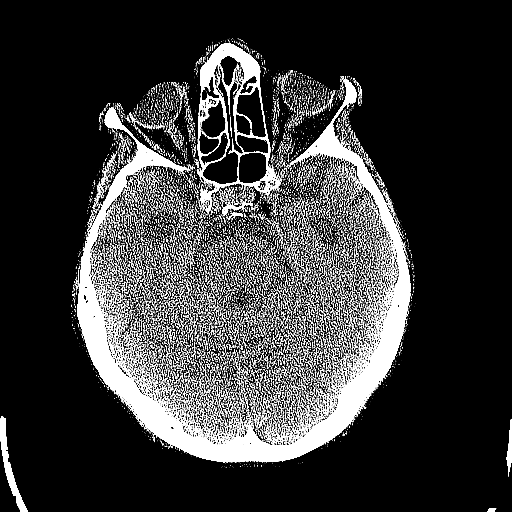
[im 23/77  bone]
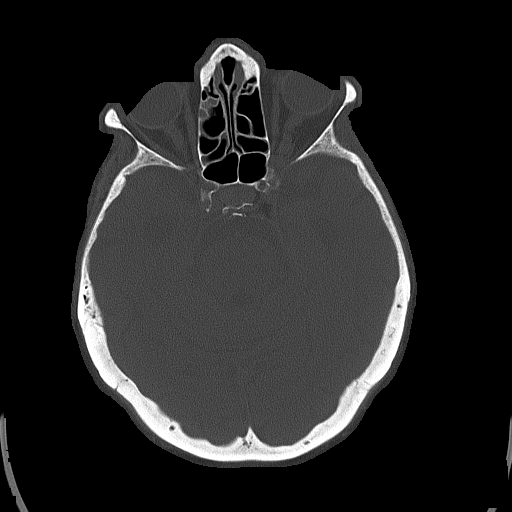
[im 27/77  brain]
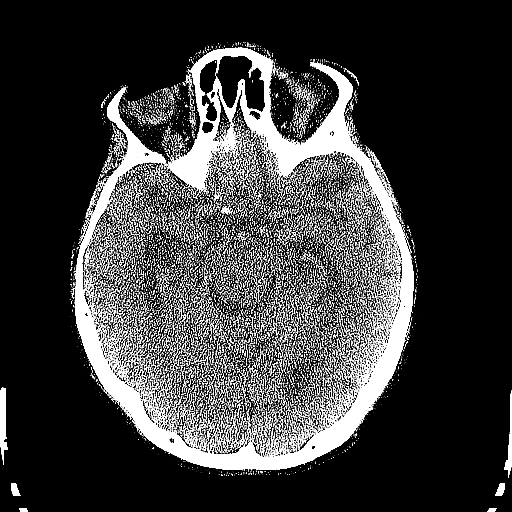
[im 31/77  brain]
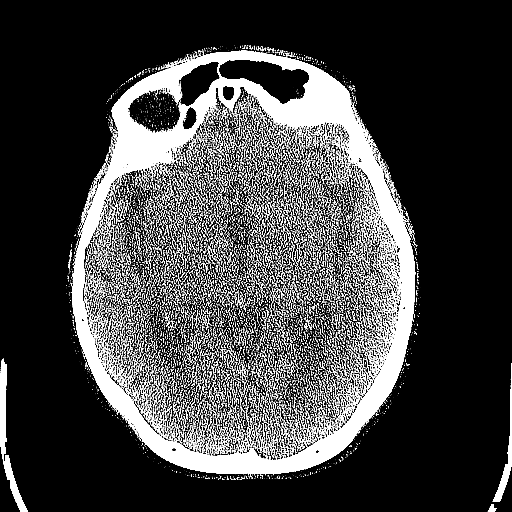
[im 35/77  brain]
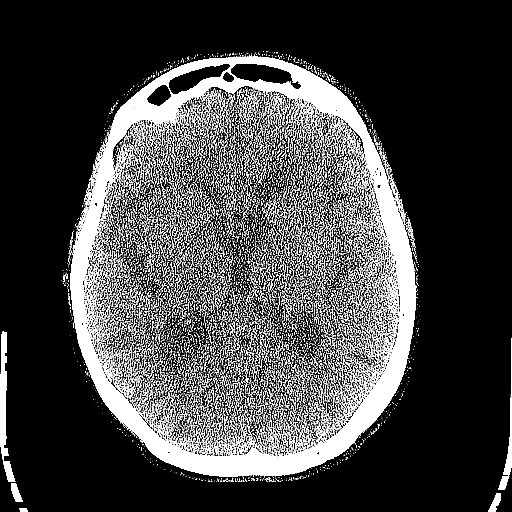
[im 42/77  brain]
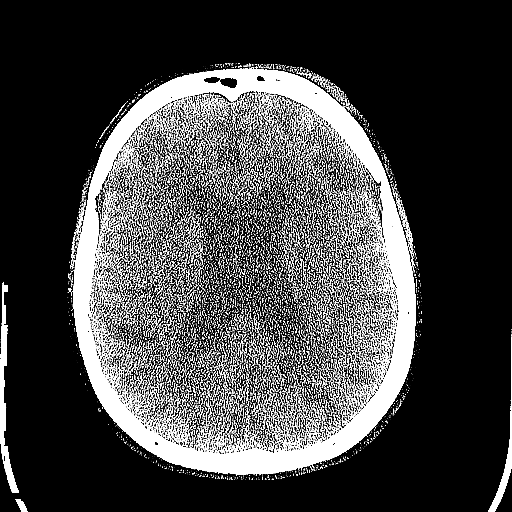
[im 42/77  bone]
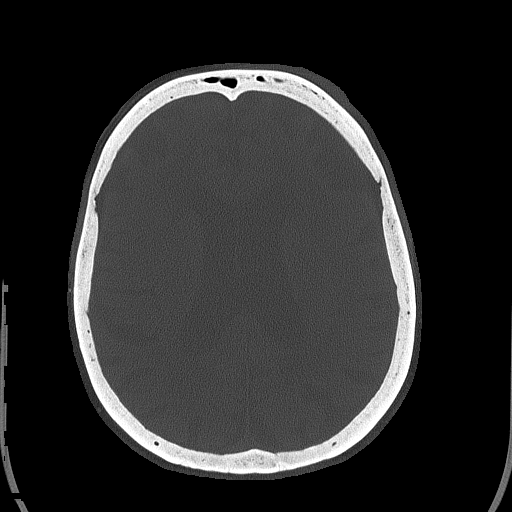
[im 46/77  brain]
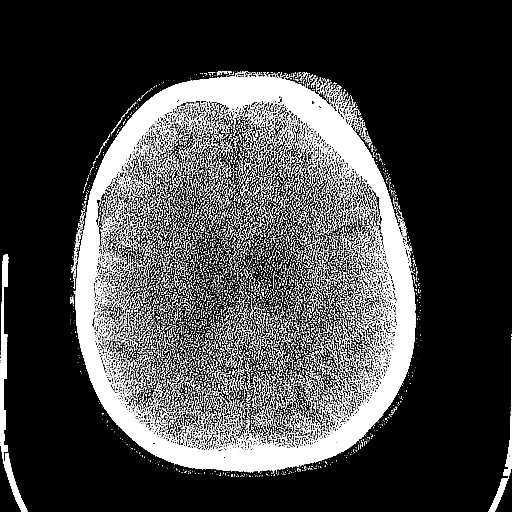
[im 50/77  brain]
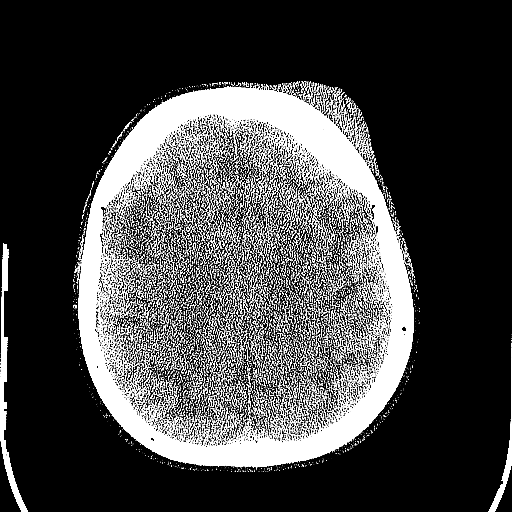
[im 54/77  brain]
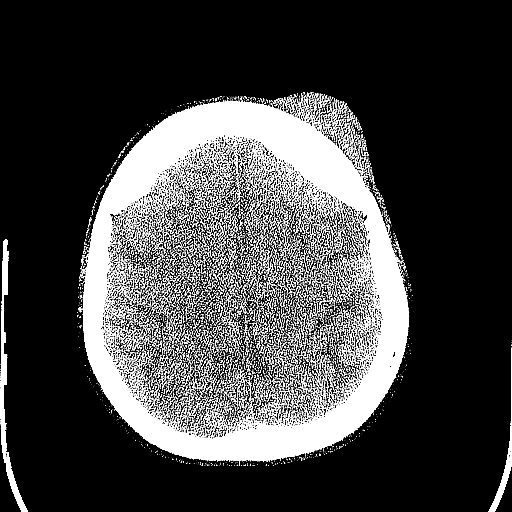
[im 58/77  brain]
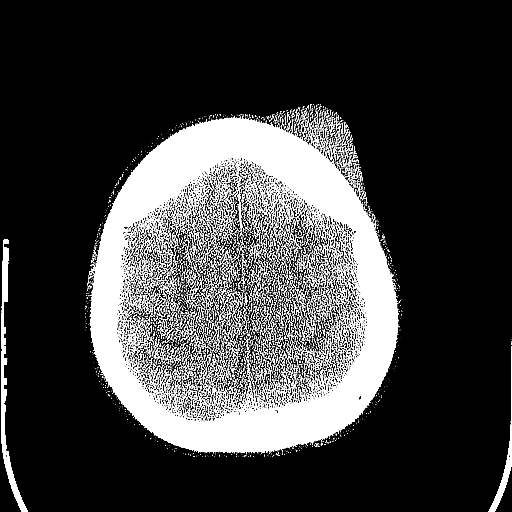
[im 58/77  bone]
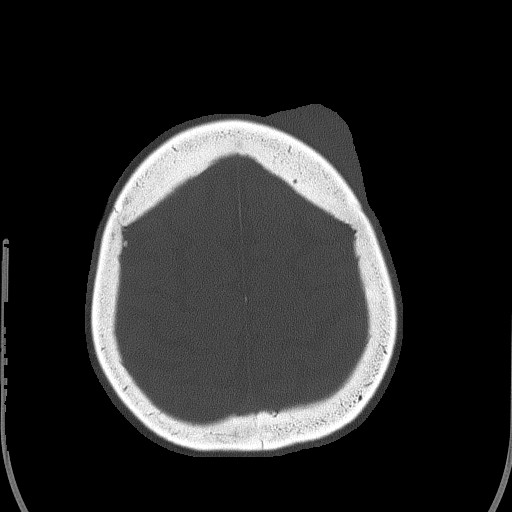
[im 65/77  brain]
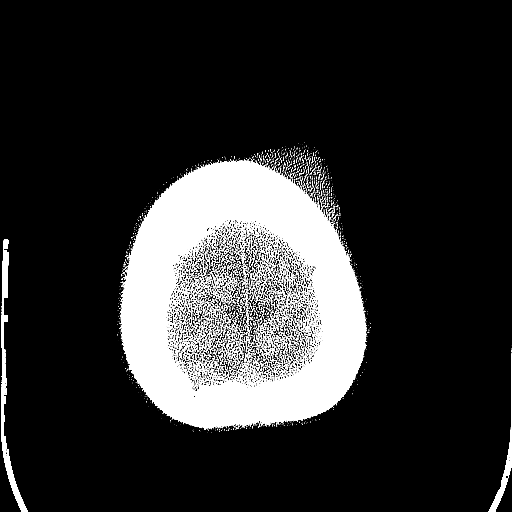
[im 69/77  brain]
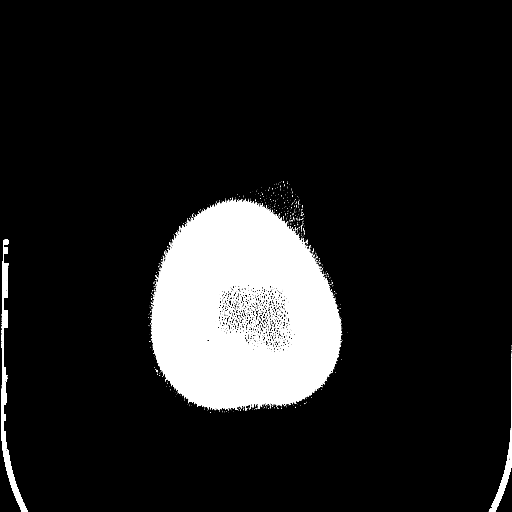
[im 73/77  brain]
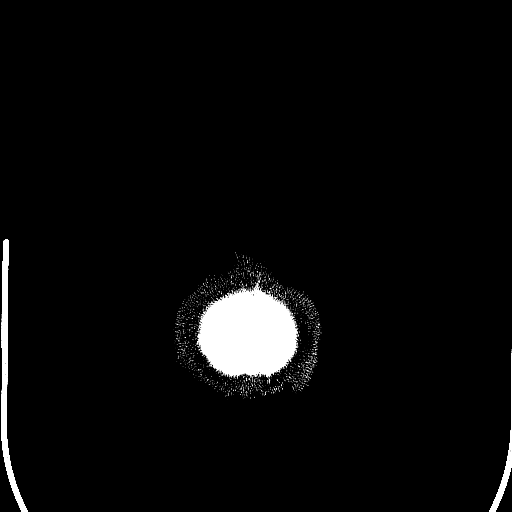

[16 of 30 positions shown; findings below may reference images not displayed]

FINDINGS: Large left frontal scalp hematoma. No skull fracture, intracranial
hemorrhage or paranasal sinus air-fluid levels. Diffusely enlarged
ventricles and subarachnoid spaces. Patchy white matter low density
in both cerebral hemispheres. Patchy low density in the midbrain,
pons and proximal medulla. Bilateral ethmoid sinus mucosal
thickening.
IMPRESSION: 1. Large left frontal scalp hematoma.
2. No skull fracture or intracranial hemorrhage.
3. Progressive atrophy and chronic small vessel ischemic changes.
